# Patient Record
Sex: Female | Born: 2000 | Race: White | Hispanic: No | Marital: Single | Smoking: Never smoker
Health system: Southern US, Community
[De-identification: ages and names within clinical notes are randomized; demographics above are authoritative.]

## PROBLEM LIST (undated history)

## (undated) DIAGNOSIS — M419 Scoliosis, unspecified: Secondary | ICD-10-CM

## (undated) DIAGNOSIS — M549 Dorsalgia, unspecified: Secondary | ICD-10-CM

## (undated) DIAGNOSIS — F32A Depression, unspecified: Secondary | ICD-10-CM

## (undated) HISTORY — DX: Scoliosis, unspecified: M41.9

## (undated) HISTORY — DX: Dorsalgia, unspecified: M54.9

## (undated) HISTORY — DX: Depression, unspecified: F32.A

---
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
## 2019-07-06 LAB — PREGNANCY, URINE: Preg Test, Ur: POSITIVE

## 2019-07-27 ENCOUNTER — Encounter: Payer: Self-pay | Admitting: General Practice

## 2019-08-12 ENCOUNTER — Other Ambulatory Visit: Payer: Self-pay

## 2019-08-12 ENCOUNTER — Telehealth (INDEPENDENT_AMBULATORY_CARE_PROVIDER_SITE_OTHER): Payer: Medicaid Other | Admitting: *Deleted

## 2019-08-12 DIAGNOSIS — Z3480 Encounter for supervision of other normal pregnancy, unspecified trimester: Secondary | ICD-10-CM

## 2019-08-12 DIAGNOSIS — Z3687 Encounter for antenatal screening for uncertain dates: Secondary | ICD-10-CM

## 2019-08-12 DIAGNOSIS — Z3490 Encounter for supervision of normal pregnancy, unspecified, unspecified trimester: Secondary | ICD-10-CM

## 2019-08-12 MED ORDER — PRENATAL 27-0.8 MG PO TABS: 1.0000 | ORAL_TABLET | Freq: Every day | ORAL | 11 refills | Status: AC

## 2019-08-12 MED ORDER — BLOOD PRESSURE KIT DEVI: 1.0000 | 0 refills | Status: DC | PRN

## 2019-08-12 NOTE — Patient Instructions (Signed)

## 2019-08-12 NOTE — Progress Notes (Addendum)
I connected with  Monica Ali on 08/12/19 at  2:15 PM EDT by virtually and verified that I am speaking with the correct person using two identifiers.   I discussed the limitations, risks, security and privacy concerns of performing an evaluation and management service by virtually  and the availability of in person appointments. I also discussed with the patient that there may be a patient responsible charge related to this service. The patient expressed understanding and agreed to proceed.  I explained I am completing her New OB Intake today. We discussed Her EDD and she informed me that her last period was much shorter than usual - with less bleeding than usual. I informed her we recommend a dating [place] to be sure of how far along she is. She voices understanding and agrees with plan of care; she states she has limited dates to do [place] but can do [place] on 08/18/19 and [place] was scheduled for 08/18/19 and I informed her she will get results of dating [place] at her new ob visit 08/19/19. She voices understanding.   I reviewed her allergies, meds, OB History, Medical /Surgical history, and completed most of appropriate screenings. I informed her of [facility] services. She does report history of depression but states she is ok right now. I offered referral to [facility] but she declines at present. She also reports using a otc medication for cough/ cold that contains phenylephrine. I advised her to stop taking that medications and gave her options to take from our approved list. She voices understanding. She also asked for a PNV RX which I sent in per protocol.    I explained I will send her the Babyscripts app and app was sent to her while on phone.   I explained we will have her take her blood presure weekly at home. She confirmed she does not have a blood pressure cuff and has medicaid. I explained I will send a blood pressure cuff prescription  to [facility] that will fill that prescription and she will pick up the cuff.  I  confirmed I will send a MyChart message with details due to lagging connection.   I explained she will have some visits in office and some virtually. She already has Sports coach.  I reviewed her new ob  appointment date/ time with her , our location and to wear mask   I explained she will have a pelvic exam, ob bloodwork,  and  genetic testing if desired,- she does want a panorama. She voices understanding.  Addendum; some screenings not done to lagging connection.   Monica Biever,RN 08/12/2019  2:21 PM    Chart reviewed for nurse visit. Agree with plan of care.   Currie Paris, NP 08/13/2019 9:16 AM

## 2019-08-18 ENCOUNTER — Other Ambulatory Visit: Payer: Self-pay | Admitting: Nurse Practitioner

## 2019-08-18 ENCOUNTER — Ambulatory Visit
Admission: RE | Admit: 2019-08-18 | Discharge: 2019-08-18 | Disposition: A | Discharge status: Home / Self Care | Payer: Medicaid Other | Source: Ambulatory Visit | Attending: Nurse Practitioner | Admitting: Nurse Practitioner

## 2019-08-18 ENCOUNTER — Other Ambulatory Visit: Payer: Self-pay

## 2019-08-18 DIAGNOSIS — Z3687 Encounter for antenatal screening for uncertain dates: Secondary | ICD-10-CM

## 2019-08-18 DIAGNOSIS — Z3480 Encounter for supervision of other normal pregnancy, unspecified trimester: Secondary | ICD-10-CM

## 2019-08-19 ENCOUNTER — Other Ambulatory Visit (HOSPITAL_COMMUNITY)
Admission: RE | Admit: 2019-08-19 | Discharge: 2019-08-19 | Disposition: A | Discharge status: Home / Self Care | Payer: Medicaid Other | Source: Ambulatory Visit | Attending: Women's Health | Admitting: Women's Health

## 2019-08-19 ENCOUNTER — Encounter: Payer: Self-pay | Admitting: Nurse Practitioner

## 2019-08-19 ENCOUNTER — Ambulatory Visit (INDEPENDENT_AMBULATORY_CARE_PROVIDER_SITE_OTHER): Payer: Medicaid Other | Admitting: Nurse Practitioner

## 2019-08-19 VITALS — BP 126/88 | HR 76 | Ht 66.0 in | Wt 175.9 lb

## 2019-08-19 DIAGNOSIS — O09619 Supervision of young primigravida, unspecified trimester: Secondary | ICD-10-CM

## 2019-08-19 DIAGNOSIS — Z3A14 14 weeks gestation of pregnancy: Secondary | ICD-10-CM

## 2019-08-19 DIAGNOSIS — Z3492 Encounter for supervision of normal pregnancy, unspecified, second trimester: Secondary | ICD-10-CM

## 2019-08-19 DIAGNOSIS — O219 Vomiting of pregnancy, unspecified: Secondary | ICD-10-CM

## 2019-08-19 LAB — POCT URINALYSIS DIP (DEVICE)
Bilirubin Urine: NEGATIVE
Glucose, UA: NEGATIVE mg/dL
Hgb urine dipstick: NEGATIVE
Ketones, ur: NEGATIVE mg/dL
Leukocytes,Ua: NEGATIVE
Nitrite: NEGATIVE
Protein, ur: NEGATIVE mg/dL
Specific Gravity, Urine: 1.025 (ref 1.005–1.030)
Urobilinogen, UA: 0.2 mg/dL (ref 0.0–1.0)
pH: 7.5 (ref 5.0–8.0)

## 2019-08-19 MED ORDER — BLOOD PRESSURE KIT DEVI: 1.0000 | 0 refills | Status: AC | PRN

## 2019-08-19 MED ORDER — DOXYLAMINE-PYRIDOXINE 10-10 MG PO TBEC: DELAYED_RELEASE_TABLET | ORAL | 2 refills | Status: DC

## 2019-08-19 NOTE — Patient Instructions (Addendum)
  Go to [facility] to pick up BP cuff [address]  [place], [place]    Morning Sickness  Morning sickness is when you feel sick to your stomach (nauseous) during pregnancy. You may feel sick to your stomach and throw up (vomit). You may feel sick in the morning, but you can feel this way at any time of day. Some women feel very sick to their stomach and cannot stop throwing up (hyperemesis gravidarum). Follow these instructions at home: Medicines  Take over-the-counter and prescription medicines only as told by your doctor. Do not take any medicines until you talk with your doctor about them first.  Taking multivitamins before getting pregnant can stop or lessen the harshness of morning sickness. Eating and drinking  Eat dry toast or crackers before getting out of bed.  Eat 5 or 6 small meals a day.  Eat dry and bland foods like rice and baked potatoes.  Do not eat greasy, fatty, or spicy foods.  Have someone cook for you if the smell of food causes you to feel sick or throw up.  If you feel sick to your stomach after taking prenatal vitamins, take them at night or with a snack.  Eat protein when you need a snack. Nuts, yogurt, and cheese are good choices.  Drink fluids throughout the day.  Try ginger ale made with real ginger, ginger tea made from fresh grated ginger, or ginger candies. General instructions  Do not use any products that have nicotine or tobacco in them, such as cigarettes and e-cigarettes. If you need help quitting, ask your doctor.  Use an air purifier to keep the air in your house free of smells.  Get lots of fresh air.  Try to avoid smells that make you feel sick.  Try: ? Wearing a bracelet that is used for seasickness (acupressure wristband). ? Going to a doctor who puts thin needles into certain body points (acupuncture) to improve how you feel. Contact a doctor if:  You need medicine to feel better.  You feel dizzy or  light-headed.  You are losing weight. Get help right away if:  You feel very sick to your stomach and cannot stop throwing up.  You pass out (faint).  You have very bad pain in your belly. Summary  Morning sickness is when you feel sick to your stomach (nauseous) during pregnancy.  You may feel sick in the morning, but you can feel this way at any time of day.  Making some changes to what you eat may help your symptoms go away. This information is not intended to replace advice given to you by your health care provider. Make sure you discuss any questions you have with your health care provider. Document Revised: 12/14/2016 Document Reviewed: 02/02/2016 Elsevier Patient Education  2020 [employer].
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
## 2019-08-19 NOTE — Progress Notes (Signed)
NEW OB packet given  Home Medicaid Form completed  

## 2019-08-20 ENCOUNTER — Encounter: Payer: Self-pay | Admitting: *Deleted

## 2019-08-20 LAB — CBC/D/PLT+RPR+RH+ABO+RUB AB...
Antibody Screen: NEGATIVE
Basophils Absolute: 0 10*3/uL (ref 0.0–0.2)
Basos: 0 %
EOS (ABSOLUTE): 0.2 10*3/uL (ref 0.0–0.4)
Eos: 2 %
HCV Ab: <0.1 s/co ratio (ref 0.0–0.9)
HIV Screen 4th Generation wRfx: NONREACTIVE
Hematocrit: 37.9 % (ref 34.0–46.6)
Hemoglobin: 12.4 g/dL (ref 11.1–15.9)
Hepatitis B Surface Ag: NEGATIVE
Immature Grans (Abs): 0 10*3/uL (ref 0.0–0.1)
Immature Granulocytes: 0 %
Lymphocytes Absolute: 1.4 10*3/uL (ref 0.7–3.1)
Lymphs: 14 %
MCH: 28.5 pg (ref 26.6–33.0)
MCHC: 32.7 g/dL (ref 31.5–35.7)
MCV: 87 fL (ref 79–97)
Monocytes Absolute: 0.6 10*3/uL (ref 0.1–0.9)
Monocytes: 6 %
Neutrophils Absolute: 7.7 10*3/uL — ABNORMAL HIGH (ref 1.4–7.0)
Neutrophils: 78 %
Platelets: 218 10*3/uL (ref 150–450)
RBC: 4.35 x10E6/uL (ref 3.77–5.28)
RDW: 13.4 % (ref 11.7–15.4)
RPR Ser Ql: NONREACTIVE
Rh Factor: POSITIVE
Rubella Antibodies, IGG: 3.59 index (ref >0.99)
WBC: 9.9 10*3/uL (ref 3.4–10.8)

## 2019-08-20 LAB — GC/CHLAMYDIA PROBE AMP (~~LOC~~) NOT AT ARMC
Chlamydia: NEGATIVE
Comment: NEGATIVE
Comment: NORMAL
Neisseria Gonorrhea: NEGATIVE

## 2019-08-20 LAB — HCV INTERPRETATION

## 2019-08-21 LAB — URINE CULTURE, OB REFLEX

## 2019-08-21 LAB — CULTURE, OB URINE

## 2019-09-03 ENCOUNTER — Encounter: Payer: Self-pay | Admitting: General Practice

## 2019-09-18 ENCOUNTER — Other Ambulatory Visit: Payer: Self-pay

## 2019-09-18 ENCOUNTER — Ambulatory Visit (INDEPENDENT_AMBULATORY_CARE_PROVIDER_SITE_OTHER): Payer: Medicaid Other | Admitting: Medical

## 2019-09-18 ENCOUNTER — Encounter: Payer: Self-pay | Admitting: Medical

## 2019-09-18 VITALS — BP 115/77 | HR 92 | Wt 177.0 lb

## 2019-09-18 DIAGNOSIS — Z3492 Encounter for supervision of normal pregnancy, unspecified, second trimester: Secondary | ICD-10-CM

## 2019-09-18 NOTE — Progress Notes (Signed)
   PRENATAL VISIT NOTE  Subjective:  Monica Ali is a 19 y.o. G1P0 at [redacted]w[redacted]d being seen today for ongoing prenatal care.  She is currently monitored for the following issues for this low-risk pregnancy and has Supervision of low-risk pregnancy and Scoliosis on their problem list.  Patient reports vaginal irritation.  Contractions: Not present. Vag. Bleeding: None.  Movement: Present. Denies leaking of fluid.   The following portions of the patient's history were reviewed and updated as appropriate: allergies, current medications, past family history, past medical history, past social history, past surgical history and problem list.   Objective:   Vitals:   09/18/19 0903  BP: 115/77  Pulse: 92  Weight: 177 lb (80.3 kg)    Fetal Status: Fetal Heart Rate (bpm): 153   Movement: Present     General:  Alert, oriented and cooperative. Patient is in no acute distress.  Skin: Skin is warm and dry. No rash noted.   Cardiovascular: Normal heart rate noted  Respiratory: Normal respiratory effort, no problems with respiration noted  Abdomen: Soft, gravid, appropriate for gestational age.  Pain/Pressure: Present     Pelvic: Cervical exam deferred        Extremities: Normal range of motion.  Edema: None  Mental Status: Normal mood and affect. Normal behavior. Normal judgment and thought content.   Assessment and Plan:  Pregnancy: G1P0 at [redacted]w[redacted]d 1. Encounter for supervision of low-risk pregnancy in second trimester - AFP, Serum, Open Spina Bifida - Doing well - Peds list provided  - Anatomy US scheduled 09/28/19 - N/V has improved, no longer taking Diclegis - Advised to try a water-based lubricant for vaginal dryness and monistat for yeast if needed  Preterm labor/second trimester symptoms and general obstetric precautions including but not limited to vaginal bleeding, contractions, leaking of fluid and fetal movement were reviewed in detail with the patient. Please refer to After Visit  Summary for other counseling recommendations.   Return in about 4 weeks (around 10/16/2019) for LOB, Virtual.  Future Appointments  Date Time Provider Department Center  09/28/2019  8:45 AM [facility]    Vonzella Nipple, PA-C

## 2019-09-18 NOTE — Patient Instructions (Signed)
Second Trimester of Pregnancy  The second trimester is from week 14 through week 27 (month 4 through 6). This is often the time in pregnancy that you feel your best. Often times, morning sickness has lessened or quit. You may have more energy, and you may get hungry more often. Your unborn baby is growing rapidly. At the end of the sixth month, he or she is about 9 inches long and weighs about 1 pounds. You will likely feel the baby move between 18 and 20 weeks of pregnancy. Follow these instructions at home: Medicines  Take over-the-counter and prescription medicines only as told by your doctor. Some medicines are safe and some medicines are not safe during pregnancy.  Take a prenatal vitamin that contains at least 600 micrograms (mcg) of folic acid.  If you have trouble pooping (constipation), take medicine that will make your stool soft (stool softener) if your doctor approves. Eating and drinking   Eat regular, healthy meals.  Avoid raw meat and uncooked cheese.  If you get low calcium from the food you eat, talk to your doctor about taking a daily calcium supplement.  Avoid foods that are high in fat and sugars, such as fried and sweet foods.  If you feel sick to your stomach (nauseous) or throw up (vomit): ? Eat 4 or 5 small meals a day instead of 3 large meals. ? Try eating a few soda crackers. ? Drink liquids between meals instead of during meals.  To prevent constipation: ? Eat foods that are high in fiber, like fresh fruits and vegetables, whole grains, and beans. ? Drink enough fluids to keep your pee (urine) clear or pale yellow. Activity  Exercise only as told by your doctor. Stop exercising if you start to have cramps.  Do not exercise if it is too hot, too humid, or if you are in a place of great height (high altitude).  Avoid heavy lifting.  Wear low-heeled shoes. Sit and stand up straight.  You can continue to have sex unless your doctor tells you not  to. Relieving pain and discomfort  Wear a good support bra if your breasts are tender.  Take warm water baths (sitz baths) to soothe pain or discomfort caused by hemorrhoids. Use hemorrhoid cream if your doctor approves.  Rest with your legs raised if you have leg cramps or low back pain.  If you develop puffy, bulging veins (varicose veins) in your legs: ? Wear support hose or compression stockings as told by your doctor. ? Raise (elevate) your feet for 15 minutes, 3-4 times a day. ? Limit salt in your food. Prenatal care  Write down your questions. Take them to your prenatal visits.  Keep all your prenatal visits as told by your doctor. This is important. Safety  Wear your seat belt when driving.  Make a list of emergency phone numbers, including numbers for family, friends, the hospital, and police and fire departments. General instructions  Ask your doctor about the right foods to eat or for help finding a counselor, if you need these services.  Ask your doctor about local prenatal classes. Begin classes before month 6 of your pregnancy.  Do not use hot tubs, steam rooms, or saunas.  Do not douche or use tampons or scented sanitary pads.  Do not cross your legs for long periods of time.  Visit your dentist if you have not done so. Use a soft toothbrush to brush your teeth. Floss gently.  Avoid all smoking, herbs,   and alcohol. Avoid drugs that are not approved by your doctor.  Do not use any products that contain nicotine or tobacco, such as cigarettes and e-cigarettes. If you need help quitting, ask your doctor.  Avoid cat litter boxes and soil used by cats. These carry germs that can cause birth defects in the baby and can cause a loss of your baby (miscarriage) or stillbirth. Contact a doctor if:  You have mild cramps or pressure in your lower belly.  You have pain when you pee (urinate).  You have bad smelling fluid coming from your vagina.  You continue to  feel sick to your stomach (nauseous), throw up (vomit), or have watery poop (diarrhea).  You have a nagging pain in your belly area.  You feel dizzy. Get help right away if:  You have a fever.  You are leaking fluid from your vagina.  You have spotting or bleeding from your vagina.  You have severe belly cramping or pain.  You lose or gain weight rapidly.  You have trouble catching your breath and have chest pain.  You notice sudden or extreme puffiness (swelling) of your face, hands, ankles, feet, or legs.  You have not felt the baby move in over an hour.  You have severe headaches that do not go away when you take medicine.  You have trouble seeing. Summary  The second trimester is from week 14 through week 27 (months 4 through 6). This is often the time in pregnancy that you feel your best.  To take care of yourself and your unborn baby, you will need to eat healthy meals, take medicines only if your doctor tells you to do so, and do activities that are safe for you and your baby.  Call your doctor if you get sick or if you notice anything unusual about your pregnancy. Also, call your doctor if you need help with the right food to eat, or if you want to know what activities are safe for you. This information is not intended to replace advice given to you by your health care provider. Make sure you discuss any questions you have with your health care provider. Document Revised: 04/25/2018 Document Reviewed: 02/07/2016 Elsevier Patient Education  2020 Elsevier Inc.  AREA PEDIATRIC/FAMILY PRACTICE PHYSICIANS  [place] ([address]) . [facility] o Chambliss, MD; Eniola, MD; Hale, MD; Hensel, MD; McDiarmid, MD; McIntyer, MD; Neal, MD; Walden, MD o [address] [place], [place] [address] o [phone] o Mon-Fri 8:30-12:30, 1:30-5:00 o Providers come to see babies at [facility] o Accepting Medicaid . [facility] o Limited providers who accept newborns: Koirala, MD; Morrow, MD; Wolters, MD o [address], [place], [place] [address] o [phone] o Mon-Fri 8:00-5:30 o Babies seen by providers at [facility] o Does NOT accept Medicaid o Please call early in hospitalization for appointment (limited availability)  . [facility] o Mulberry, MD o [address] [place], [place] [address] o [phone] o Mon, Tue, Thur, Fri 8:30-5:00, Wed 10:00-7:00 (closed 1-2pm) o Babies seen by [facility] providers o Accepting Medicaid . [facility] o Rubin, MD o [address], [place], [place] [address] o [phone] o Mon-Fri 8:30-5:00, Sat 8:30-12:00 o Provider comes to see babies at [facility] o Accepting Medicaid o Must have been referred from current patients or contacted office prior to delivery . [facility] (  [facility]) o Brown, MD; Chandler, MD; Ettefagh, MD; Grant, MD; Lester, MD; McCormick, MD; McQueen, MD; Prose, MD; Simha, MD; Stanley, MD; Stryffeler, NP; Tebben, NP o [address], [place], [place] [address] o [phone] o Mon, Tue, Thur, Fri 8:30-5:30, Wed 9:30-5:30, Sat 8:30-12:30 o Babies seen by [facility] providers o Accepting Medicaid o Only accepting infants of first-time parents or siblings of current patients o Hospital discharge coordinator will make follow-up appointment . Jack Amos o [address], [place], [place]  [address] o [phone]   Fax - [phone] . [facility] o [address], [place], [place]  [address] o Phone - [phone]   Fax - [phone] . Shilpa Gosrani o [address], [place], [place]  [address] o [phone]  [place] ([address]) . [facility] o Bates, MD; [place], MD; Cooper, Cox, MD; MD; Davis, MD; Dovico, MD;  Ettefaugh, MD; Little, MD; Lowe, MD; Keiffer, MD; Melvin, MD; Sumner, MD; Williams, MD o [address], [place], [place] [address] o [phone] o Mon-Fri 8:30-5:00 (extended evenings Mon-Thur as needed), Sat-Sun 10:00-1:00 o Providers come to see babies at [facility] o Accepting Medicaid for families of first-time babies and families with all children in the household age 3 and under. Must register with office prior to making appointment (M-F only). . [facility] o Henson, NP; Knapp, MD; Lalonde, MD; Tysinger, PA o [address] [place], [place] [address] o [phone] o Mon-Fri 8:00-5:00 o Babies seen by providers at [facility] o Does NOT accept Medicaid/Commercial Insurance Only . [facility] [place] ([facility])  o Artis, MD; Barnes, MD; Bratton, MD; Coccaro, MD; Lockett Gardner, MD; Kramer, MD; [place], MD; Netherton, MD; Poleto, MD; Skinner, MD o [address] [place], [place] [address] o [phone] o Mon-Fri 8:30-5:30, Sat (Oct.-Mar.) 9:00-1:00 o Babies seen by providers at [facility] o Accepting Medicaid  [place] ([address]) . [facility] o Reid, MD; Warner, MD o [address], [place], [place] [address] o [phone] o Mon-Fri 8:30-5:00, Sat 8:30-12:00 o Providers come to see babies at [facility] o Does NOT accept Medicaid . [facility] o Becker, PA; Hagler, MD; Scifres, PA; Sun, MD; Swayne, MD o [address], [place], [place] [address] o [phone] o Mon-Fri 8:00-5:00 o Babies seen by providers at [facility] o Does NOT accept Medicaid o Only accepting babies of parents who are patients o Please call early in hospitalization for appointment (limited availability) . [facility] o Clark, MD; Frye, MD; Kelleher, MD; Mack, NP; Miller, MD; O'Keller, MD; Patterson, NP; Pudlo, MD; Puzio, MD; Thomas, MD;  Tucker, MD; Twiselton, MD o [address], [place], [place] [address] o [phone] o Mon-Fri 8:00-5:00, Sat 9:00-12:00 o Providers come to see babies at [facility] o Does NOT accept Medicaid  [place] ([address]) . [facility] o Limited providers accepting new patients: Brake, NP; Wharton, PA o [address], [place], [place] [address] o [phone] o Mon-Fri 8:00-5:00 o Babies seen by providers at [facility] o Does NOT accept Medicaid o Only accepting babies of parents who are patients o Please call early in hospitalization for appointment (limited availability) . [facility] o Gay, MD; Quinlan, MD o [address] [place], [place] [address] o [phone] (press 1 to schedule appointment) o Mon-Fri 8:00-5:00 o Providers come to   see babies at [facility] o Does NOT accept Medicaid . [facility] o Mazer, MD o [address] [place], [place] [address] o [phone] o Mon-Fri 8:30-5:00 (lunch 12:30-1:00), extended hours by appointment only Wed 5:00-6:30 o Babies seen by [facility] providers o Accepting Medicaid . [facility] o Banks, MD; Jordan, MD; Koberlein, MD o [address], [place], [place] [address] o [phone] o Mon-Fri 8:00-5:00 o Babies seen by [facility] providers o Does NOT accept Medicaid . [facility] o Parker, MD; Hunter, MD; Wallace, DO o [address] [place], [place] [address] o [phone] o Mon-Fri 8:00-5:00 o Babies seen by [facility] providers o Does NOT accept Medicaid . [facility] o Brandon, PA; Brecken, PA; Christy, NP; Dees, MD; DeClaire, MD; DeWeese, MD; Hansen, NP; Mills, NP; Parrish, NP; Smoot, NP; Annalise, MD; Vapne, MD o [address] [place], [place] [address] o [phone] o Mon-Fri 8:30-5:00, Sat 10:00-1:00 o Providers come to see babies at [facility] o Does NOT accept Medicaid o Free prenatal information session Tuesdays at 4:45pm . [facility] o Bouska, MD; Gordon, PA; Jeffery, PA; Weber, PA o [address] [place] [address] o [phone] o Mon-Fri 7:30-5:30 o Babies seen by [facility] providers . [facility] o [address], [place], [place]  [address] o [phone]   Fax - [phone]  [place] ([address]) . [facility] o Reese, MD o [address] [place], [place] [address] o [phone] o Mon-Thur 8:00-6:00 o Providers come to see babies at [facility] o Accepting Medicaid . [facility] o Anderson, NP; Badger, MD; Beal, PA; Spencer, PA o [address] [place], [place] [address] o [phone] o Mon-Thur 7:30-7:30, Fri 7:30-4:30 o Babies seen by [facility] providers o Accepting Medicaid . [facility] o Agbuya, MD; Klett, NP; Romgoolam, MD o [address], [place], [place] [address] o [phone] o Mon-Fri 8:30-5:00, Sat 8:30-12:00 o Providers come to see babies at [facility] o Accepting Medicaid o Must have "Meet & Greet" appointment at office prior to delivery . [facility] ([facility] o McCord, MD; Wallace, MD; Wood, MD o [address], [place], [place] [address] o [phone] o Mon-Wed 8:00-6:00, Thur-Fri 8:00-5:00, Sat 9:00-12:00 o Providers come to see babies at [facility] o Does NOT accept Medicaid o Only accepting siblings of current patients . [facility]  o [address], [place], [place]  [address] o [phone]   Fax - [phone] . [facility] o [address], [place], [place]  [address] o [phone]   Fax - [phone]  [place] ([address]) . [facility] o Cirigliano, DO; Matthews, DO o [address] [place], [place] [address] o [phone] o Mon-Fri 7:00-5:00 o Babies seen by [facility] providers o Does NOT accept Medicaid . [facility] o Briscoe, MD; Howley, PA; Moreira, PA o [address], [place], [place] [address] o [phone] o Mon-Fri 8:00-5:00 o Babies seen by [facility] providers o Accepting Medicaid . [facility] o Boyd, MD; Church, PA; Jones, NP; Osborn, PA o [address], , [place] [address] o [phone] o Mon-Fri 8:00-5:00 o Babies seen by providers at [facility] o   Accepting Medicaid  [place] ([address]) . [facility] [place] o [place], DO o [address] [place], [place] [address] o [phone] o Mon-Fri 8:00-5:00 o Babies seen by [facility] providers o Does NOT accept Medicaid o Limited availability, please call early in hospitalization to schedule follow-up . [facility] o Calderon, PA; Cummings, MD; Dillard, MD; Martin, PA; Olson, MD; VanDeven, PA o [address], [place], [place] [address] o [phone] o Mon-Fri 8:30-5:00, Sat 9:00-12:00 o Babies seen by providers at [facility] o Accepting Medicaid o Please register online then schedule online or call office o [email or web address] . [facility] ([facility] o Hunter, NP; Kumar, MD; Martin Rogers, PA o [address], [place], [place] [address] o [phone] o Mon-Fri 8:00-5:00 o Babies seen by providers at [facility] o Accepting Medicaid . [facility] ([facility] o [place], MD; Kristi Fleenor, NP; West, MD o [address], [place], [place] [address] o [phone] o Mon-Fri 8:00-5:30, Sat&Sun by  appointment (phones open at 8:30) o Babies seen by [facility] providers o Accepting Medicaid o Must be a first-time baby or sibling of current patient . [facility] [place]  o [address], [place], [place]  [address] o [phone]   Fax - [phone]  [place] ([address]) . [facility] o Brown, PA; Cowen, PA; Rice, MD; Helton, PA; Spry, MD o [address] [place], [place] [address] o [phone] o Mon-Thur 8:00-7:00, Fri 8:00-5:00, Sat 8:00-12:00, Sun 9:00-12:00 o Babies seen by [facility] providers o Accepting Medicaid . [facility] [place] o Coe-Goins, MD; [place], MD; Pierre-Louis, MD o [address] [place] [address], [place], [place] [address] o [phone] o Mon-Thur 8:00-5:00 o Babies seen by providers at [facility] o Accepting Medicaid . [facility] o Bratton, MD; Coe-Goins, MD; Hayes, MD; Lewis, MD; List, MD; Lott, MD; [place], MD; Moran, MD; O'Neal, MD; Pierre-Louis, MD; Pitonzo, MD; Scholer, MD; Spangle, MD o [address] [place], [place] [address] o [phone] o Mon-Fri 8:00-5:30, Sat (Oct.-Mar.) 9:00-1:00 o Babies seen by providers at [facility] o Accepting Medicaid o Must fill out new patient packet, available online at [email or web address] . [facility] ([facility]) o Friddle, NP; Harris, NP; Kelly, NP; Logan, MD; Melvin, PA; Poth, MD; Ramadoss, MD; Stanton, NP o [address], [place], [place] [address] o [phone] o Mon-Thur 8:00-5:30, Fri 8:00-5:00 o Babies seen by providers at [facility] o Accepting Medicaid  [place] ([address]) . [facility] o Dixon, PA; [place], MD; Pickard, MD; Tapia, PA o [address], [place], [place] [address] o [phone] o Mon-Fri 8:00-5:00 o Babies seen  by providers at [facility] o Accepting Medicaid   [place] ([address]) . [facility] [place] o Masneri, DO; Meyers, MD; Seifer, PA o [address], [place],  [address] o [phone] o Mon-Fri 8:00-5:00 o Babies seen by providers at [facility] o Does NOT accept Medicaid o Limited appointment availability, please call early in hospitalization  . [facility] [place] o Kunedd, DO; McGowen, MD o [address], [place],  [address] o [phone] o   Mon-Fri 8:00-5:00 o Babies seen by [facility] providers o Does NOT accept Medicaid . [facility] [place] o Cameron, MD; MacDonald, MD; Michaels, PA; Nayak, MD o [address] [place] [address], [place], [place] [address] o [phone] o Mon-Fri 8:00-5:00 o After hours clinic ([address] [place], [place] [address]) [phone] Mon-Fri 5:00-8:00, Sat 12:00-6:00, Sun 10:00-4:00 o Babies seen by [facility] providers o Accepting Medicaid . [facility] [place] o [address], [place], [place]  [address] o [phone]   Fax - [phone]  [place] ([address]) . [facility] [place] o Andy, MD o [address], [place], [place] [address] o [phone] o Mon-Fri 8:00-5:00 o Babies seen by [facility] providers o Does NOT accept Medicaid . [facility] [place] ([facility] [place]) o Eksir, MD o [address], [place], [place] [address] o [phone] o Mon-Thur 8:00-7:00, Fri 8:00-5:00, Sat 8:00-12:00 o Babies seen by providers at [facility] o Accepting Medicaid - but does not have vaccinations in office (must be received elsewhere) o Limited availability, please call early in hospitalization  [place] ([address]) . [facility]  o Charlene Flemming, MD o [address],   [address] o [phone]  Fax [phone]    

## 2019-09-19 LAB — AFP, SERUM, OPEN SPINA BIFIDA
AFP MoM: 1.02
AFP Value: 41.1 ng/mL
Gest. Age on Collection Date: 18 weeks
Maternal Age At EDD: 20.1 yr
OSBR Risk 1 IN: 10000
Test Results:: NEGATIVE
Weight: 177 [lb_av]

## 2019-09-28 ENCOUNTER — Other Ambulatory Visit: Payer: Self-pay | Admitting: Nurse Practitioner

## 2019-09-28 ENCOUNTER — Other Ambulatory Visit: Payer: Self-pay

## 2019-09-28 ENCOUNTER — Other Ambulatory Visit: Payer: Self-pay | Admitting: *Deleted

## 2019-09-28 ENCOUNTER — Ambulatory Visit: Payer: Medicaid Other | Attending: Nurse Practitioner

## 2019-09-28 DIAGNOSIS — Z3492 Encounter for supervision of normal pregnancy, unspecified, second trimester: Secondary | ICD-10-CM

## 2019-09-28 DIAGNOSIS — Z362 Encounter for other antenatal screening follow-up: Secondary | ICD-10-CM

## 2019-10-16 ENCOUNTER — Encounter: Payer: Self-pay | Admitting: Medical

## 2019-10-16 ENCOUNTER — Telehealth (INDEPENDENT_AMBULATORY_CARE_PROVIDER_SITE_OTHER): Payer: Medicaid Other | Admitting: Medical

## 2019-10-16 VITALS — BP 118/75 | HR 76

## 2019-10-16 DIAGNOSIS — Z3402 Encounter for supervision of normal first pregnancy, second trimester: Secondary | ICD-10-CM

## 2019-10-16 DIAGNOSIS — Z3492 Encounter for supervision of normal pregnancy, unspecified, second trimester: Secondary | ICD-10-CM

## 2019-10-16 DIAGNOSIS — Z3A21 21 weeks gestation of pregnancy: Secondary | ICD-10-CM

## 2019-10-16 NOTE — Progress Notes (Signed)
I connected with Rolene Course 10/16/19 at  9:15 AM EDT by: MyChart video and verified that I am speaking with the correct person using two identifiers.  Patient is located at home and provider is located at [facility].     The purpose of this virtual visit is to provide medical care while limiting exposure to the novel coronavirus. I discussed the limitations, risks, security and privacy concerns of performing an evaluation and management service by MyChart video and the availability of in person appointments. I also discussed with the patient that there may be a patient responsible charge related to this service. By engaging in this virtual visit, you consent to the provision of healthcare.  Additionally, you authorize for your insurance to be billed for the services provided during this visit.  The patient expressed understanding and agreed to proceed.  The following staff members participated in the virtual visit:  Corinda Gubler, CMA    PRENATAL VISIT NOTE  Subjective:  Meriam Wedin is a 19 y.o. G1P0 at [redacted]w[redacted]d  for phone visit for ongoing prenatal care.  She is currently monitored for the following issues for this low-risk pregnancy and has Supervision of low-risk pregnancy and Scoliosis on their problem list.  Patient reports no complaints.  Contractions: Not present. Vag. Bleeding: None.  Movement: Present. Denies leaking of fluid.   The following portions of the patient's history were reviewed and updated as appropriate: allergies, current medications, past family history, past medical history, past social history, past surgical history and problem list.   Objective:   Vitals:   10/16/19 0920  BP: 118/75  Pulse: 76   Self-Obtained  Fetal Status:     Movement: Present     Assessment and Plan:  Pregnancy: G1P0 at [redacted]w[redacted]d 1. Encounter for supervision of low-risk pregnancy in second trimester - Doing well - Has Peds list  - F/U US on 10/26/19  2. [redacted] weeks gestation of pregnancy  Preterm  labor symptoms and general obstetric precautions including but not limited to vaginal bleeding, contractions, leaking of fluid and fetal movement were reviewed in detail with the patient.  Return in about 4 weeks (around 11/13/2019) for LOB, Virtual.  Future Appointments  Date Time Provider Department Center  10/26/2019  9:45 AM [facility] NURSE [facility]  10/26/2019 10:00 AM [facility] US1 [facility]     Time spent on virtual visit: 15 minutes  Vonzella Nipple, PA-C

## 2019-10-16 NOTE — Patient Instructions (Signed)

## 2019-10-26 ENCOUNTER — Other Ambulatory Visit: Payer: Self-pay | Admitting: *Deleted

## 2019-10-26 ENCOUNTER — Encounter: Payer: Self-pay | Admitting: *Deleted

## 2019-10-26 ENCOUNTER — Other Ambulatory Visit: Payer: Self-pay

## 2019-10-26 ENCOUNTER — Ambulatory Visit: Payer: Medicaid Other | Attending: Obstetrics

## 2019-10-26 ENCOUNTER — Ambulatory Visit: Payer: Medicaid Other | Admitting: *Deleted

## 2019-10-26 DIAGNOSIS — Z3492 Encounter for supervision of normal pregnancy, unspecified, second trimester: Secondary | ICD-10-CM | POA: Insufficient documentation

## 2019-10-26 DIAGNOSIS — O365990 Maternal care for other known or suspected poor fetal growth, unspecified trimester, not applicable or unspecified: Secondary | ICD-10-CM

## 2019-10-26 DIAGNOSIS — Z3A23 23 weeks gestation of pregnancy: Secondary | ICD-10-CM | POA: Diagnosis not present

## 2019-10-26 DIAGNOSIS — O321XX0 Maternal care for breech presentation, not applicable or unspecified: Secondary | ICD-10-CM

## 2019-10-26 DIAGNOSIS — Z3687 Encounter for antenatal screening for uncertain dates: Secondary | ICD-10-CM | POA: Diagnosis not present

## 2019-10-26 DIAGNOSIS — Z362 Encounter for other antenatal screening follow-up: Secondary | ICD-10-CM

## 2019-11-16 ENCOUNTER — Encounter: Payer: Self-pay | Admitting: *Deleted

## 2019-11-16 ENCOUNTER — Ambulatory Visit: Payer: Medicaid Other | Attending: Obstetrics and Gynecology

## 2019-11-16 ENCOUNTER — Other Ambulatory Visit: Payer: Self-pay

## 2019-11-16 ENCOUNTER — Ambulatory Visit: Payer: Medicaid Other | Admitting: *Deleted

## 2019-11-16 VITALS — BP 120/70 | HR 83

## 2019-11-16 DIAGNOSIS — Z3492 Encounter for supervision of normal pregnancy, unspecified, second trimester: Secondary | ICD-10-CM | POA: Diagnosis present

## 2019-11-16 DIAGNOSIS — Z3687 Encounter for antenatal screening for uncertain dates: Secondary | ICD-10-CM | POA: Diagnosis not present

## 2019-11-16 DIAGNOSIS — Z362 Encounter for other antenatal screening follow-up: Secondary | ICD-10-CM | POA: Diagnosis not present

## 2019-11-16 DIAGNOSIS — O365920 Maternal care for other known or suspected poor fetal growth, second trimester, not applicable or unspecified: Secondary | ICD-10-CM | POA: Diagnosis not present

## 2019-11-16 DIAGNOSIS — Z3A26 26 weeks gestation of pregnancy: Secondary | ICD-10-CM

## 2019-11-16 DIAGNOSIS — O365990 Maternal care for other known or suspected poor fetal growth, unspecified trimester, not applicable or unspecified: Secondary | ICD-10-CM

## 2019-11-17 ENCOUNTER — Other Ambulatory Visit: Payer: Self-pay | Admitting: *Deleted

## 2019-11-17 DIAGNOSIS — IMO0002 Reserved for concepts with insufficient information to code with codable children: Secondary | ICD-10-CM

## 2019-11-29 ENCOUNTER — Inpatient Hospital Stay (HOSPITAL_COMMUNITY)
Admission: AD | Admit: 2019-11-29 | Discharge: 2019-11-29 | Disposition: A | Discharge status: Home / Self Care | Payer: Medicaid Other | Attending: Obstetrics & Gynecology | Admitting: Obstetrics & Gynecology

## 2019-11-29 ENCOUNTER — Emergency Department: Admit: 2019-11-29 | Payer: Self-pay

## 2019-11-29 ENCOUNTER — Other Ambulatory Visit: Payer: Self-pay

## 2019-11-29 ENCOUNTER — Encounter (HOSPITAL_COMMUNITY): Payer: Self-pay | Admitting: Obstetrics & Gynecology

## 2019-11-29 DIAGNOSIS — O26893 Other specified pregnancy related conditions, third trimester: Secondary | ICD-10-CM | POA: Insufficient documentation

## 2019-11-29 DIAGNOSIS — R42 Dizziness and giddiness: Secondary | ICD-10-CM | POA: Diagnosis not present

## 2019-11-29 DIAGNOSIS — M25559 Pain in unspecified hip: Secondary | ICD-10-CM | POA: Diagnosis not present

## 2019-11-29 DIAGNOSIS — Z3A28 28 weeks gestation of pregnancy: Secondary | ICD-10-CM | POA: Diagnosis not present

## 2019-11-29 LAB — URINALYSIS, ROUTINE W REFLEX MICROSCOPIC
Bilirubin Urine: NEGATIVE
Glucose, UA: NEGATIVE mg/dL
Hgb urine dipstick: NEGATIVE
Ketones, ur: NEGATIVE mg/dL
Leukocytes,Ua: NEGATIVE
Nitrite: NEGATIVE
Protein, ur: NEGATIVE mg/dL
Specific Gravity, Urine: 1.024 (ref 1.005–1.030)
pH: 6 (ref 5.0–8.0)

## 2019-11-29 LAB — COMPREHENSIVE METABOLIC PANEL
ALT: 16 U/L (ref 0–44)
AST: 12 U/L — ABNORMAL LOW (ref 15–41)
Albumin: 2.6 g/dL — ABNORMAL LOW (ref 3.5–5.0)
Alkaline Phosphatase: 71 U/L (ref 38–126)
Anion gap: 6 (ref 5–15)
BUN: 11 mg/dL (ref 6–20)
CO2: 24 mmol/L (ref 22–32)
Calcium: 8.7 mg/dL — ABNORMAL LOW (ref 8.9–10.3)
Chloride: 106 mmol/L (ref 98–111)
Creatinine, Ser: 0.71 mg/dL (ref 0.44–1.00)
GFR, Estimated: >60 mL/min (ref >60)
Glucose, Bld: 91 mg/dL (ref 70–99)
Potassium: 4 mmol/L (ref 3.5–5.1)
Sodium: 136 mmol/L (ref 135–145)
Total Bilirubin: 0.2 mg/dL — ABNORMAL LOW (ref 0.3–1.2)
Total Protein: 6.4 g/dL — ABNORMAL LOW (ref 6.5–8.1)

## 2019-11-29 LAB — CBC WITH DIFFERENTIAL/PLATELET
Abs Immature Granulocytes: 0.04 10*3/uL (ref 0.00–0.07)
Basophils Absolute: 0 10*3/uL (ref 0.0–0.1)
Basophils Relative: 0 %
Eosinophils Absolute: 0.1 10*3/uL (ref 0.0–0.5)
Eosinophils Relative: 1 %
HCT: 35.1 % — ABNORMAL LOW (ref 36.0–46.0)
Hemoglobin: 11 g/dL — ABNORMAL LOW (ref 12.0–15.0)
Immature Granulocytes: 0 %
Lymphocytes Relative: 12 %
Lymphs Abs: 1.3 10*3/uL (ref 0.7–4.0)
MCH: 27.1 pg (ref 26.0–34.0)
MCHC: 31.3 g/dL (ref 30.0–36.0)
MCV: 86.5 fL (ref 80.0–100.0)
Monocytes Absolute: 0.6 10*3/uL (ref 0.1–1.0)
Monocytes Relative: 6 %
Neutro Abs: 8.5 10*3/uL — ABNORMAL HIGH (ref 1.7–7.7)
Neutrophils Relative %: 81 %
Platelets: 216 10*3/uL (ref 150–400)
RBC: 4.06 MIL/uL (ref 3.87–5.11)
RDW: 14 % (ref 11.5–15.5)
WBC: 10.6 10*3/uL — ABNORMAL HIGH (ref 4.0–10.5)
nRBC: 0 % (ref 0.0–0.2)

## 2019-11-29 NOTE — MAU Provider Note (Signed)
Obstetric Attending MAU Note  Chief Complaint:  Dizziness and Hip Pain   First Provider Initiated Contact with Patient 11/29/19 1704     HPI: Monica Ali is a 19 y.o. G1P0 at 66w1dwho presents to maternity admissions reporting episodes of dizziness over the past three weeks.  Feels like she is anemic. No syncopal episodes, no loss of consciousness.  Also reports having some chest tightness this morning, went to Urgent Care for evaluation and they sent her here. No current SOB. Also complains of hip pain with growing pregnancy.  Denies any abnormal vaginal discharge, fevers, chills, sweats, dysuria, nausea, vomiting, other GI or GU symptoms or other general symptoms. Denies contractions, leakage of fluid or vaginal bleeding. Good fetal movement.   [facility]   Diagnosis Date Noted  . Supervision of low-risk pregnancy 08/12/2019  . Scoliosis 03/12/2011    Past Medical History:  Diagnosis Date  . Back pain   . Depression   . Scoliosis     OB History  Gravida Para Term Preterm AB Living  1            SAB TAB Ectopic Multiple Live Births               # Outcome Date GA Lbr Len/2nd Weight Sex Delivery Anes PTL Lv  1 Current             Past Surgical History:  Procedure Laterality Date  . WRIST SURGERY  2019    Family History: Family History  Problem Relation Age of Onset  . Breast cancer Mother   . Bipolar disorder Mother   . Cancer Mother        breast cancer  . Arthritis Father   . Ovarian cancer Maternal Aunt     Social History: Social History   Tobacco Use  . Smoking status: Never Smoker  . Smokeless tobacco: Never Used  Vaping Use  . Vaping Use: Former  Substance Use Topics  . Alcohol use: Not Currently  . Drug use: Not Currently    Types: Marijuana    Comment: a month     Allergies: No Known Allergies  Medications Prior to Admission  Medication Sig Dispense Refill Last Dose  . Prenatal Vit-Fe  Fumarate-FA (MULTIVITAMIN-PRENATAL) 27-0.8 MG TABS tablet Take 1 tablet by mouth daily at 12 noon. 30 tablet 11 11/28/2019 at Unknown time  . acetaminophen (TYLENOL) 325 MG tablet Take 650 mg by mouth every 6 (six) hours as needed.   Unknown at Unknown time  . Blood Pressure Monitoring (BLOOD PRESSURE KIT) DEVI 1 Device by Does not apply route as needed. 1 each 0     ROS: Pertinent findings in history of present illness.  Physical Exam  Blood pressure 120/70, pulse 99, temperature 98.4 F (36.9 C), temperature source Oral, resp. rate 16, last menstrual period 05/13/2019, SpO2 97 %. CONSTITUTIONAL: Well-developed, well-nourished female in no acute distress.  HENT:  Normocephalic, atraumatic, External right and left ear normal.  EYES: Conjunctivae and EOM are normal. Pupils are equal, round, and reactive to light. No scleral icterus.  NECK: Normal range of motion, supple, no masses SKIN: Skin is warm and dry. No rash noted. Not diaphoretic. No erythema. No pallor. NLubeck Alert and oriented to person, place, and time. Normal reflexes, muscle tone coordination. No cranial nerve deficit noted. PSYCHIATRIC: Normal mood and affect. Normal behavior. Normal judgment and thought content. CARDIOVASCULAR: Normal heart rate noted, regular rhythm RESPIRATORY:  Effort and breath sounds normal, no problems with respiration noted ABDOMEN: Gravid appropriate for gestational age MUSCULOSKELETAL: Normal range of motion. No edema and no tenderness. 2+ distal pulses.   FHT:  Baseline 155 , moderate variability, accelerations present, no decelerations Contractions: none   Labs: Results for orders placed or performed during the hospital encounter of 11/29/19 (from the past 24 hour(s))  Urinalysis, Routine w reflex microscopic Urine, Clean Catch     Status: Abnormal   Collection Time: 11/29/19  4:14 PM  Result Value Ref Range   Color, Urine YELLOW YELLOW   APPearance HAZY (A) CLEAR   Specific Gravity,  Urine 1.024 1.005 - 1.030   pH 6.0 5.0 - 8.0   Glucose, UA NEGATIVE NEGATIVE mg/dL   Hgb urine dipstick NEGATIVE NEGATIVE   Bilirubin Urine NEGATIVE NEGATIVE   Ketones, ur NEGATIVE NEGATIVE mg/dL   Protein, ur NEGATIVE NEGATIVE mg/dL   Nitrite NEGATIVE NEGATIVE   Leukocytes,Ua NEGATIVE NEGATIVE  CBC with Differential/Platelet     Status: Abnormal   Collection Time: 11/29/19  4:54 PM  Result Value Ref Range   WBC 10.6 (H) 4.0 - 10.5 K/uL   RBC 4.06 3.87 - 5.11 MIL/uL   Hemoglobin 11.0 (L) 12.0 - 15.0 g/dL   HCT 35.1 (L) 36 - 46 %   MCV 86.5 80.0 - 100.0 fL   MCH 27.1 26.0 - 34.0 pg   MCHC 31.3 30.0 - 36.0 g/dL   RDW 14.0 11.5 - 15.5 %   Platelets 216 150 - 400 K/uL   nRBC 0.0 0.0 - 0.2 %   Neutrophils Relative % 81 %   Neutro Abs 8.5 (H) 1.7 - 7.7 K/uL   Lymphocytes Relative 12 %   Lymphs Abs 1.3 0.7 - 4.0 K/uL   Monocytes Relative 6 %   Monocytes Absolute 0.6 0.1 - 1.0 K/uL   Eosinophils Relative 1 %   Eosinophils Absolute 0.1 0.0 - 0.5 K/uL   Basophils Relative 0 %   Basophils Absolute 0.0 0.0 - 0.1 K/uL   Immature Granulocytes 0 %   Abs Immature Granulocytes 0.04 0.00 - 0.07 K/uL  Comprehensive metabolic panel     Status: Abnormal   Collection Time: 11/29/19  4:54 PM  Result Value Ref Range   Sodium 136 135 - 145 mmol/L   Potassium 4.0 3.5 - 5.1 mmol/L   Chloride 106 98 - 111 mmol/L   CO2 24 22 - 32 mmol/L   Glucose, Bld 91 70 - 99 mg/dL   BUN 11 6 - 20 mg/dL   Creatinine, Ser 0.71 0.44 - 1.00 mg/dL   Calcium 8.7 (L) 8.9 - 10.3 mg/dL   Total Protein 6.4 (L) 6.5 - 8.1 g/dL   Albumin 2.6 (L) 3.5 - 5.0 g/dL   AST 12 (L) 15 - 41 U/L   ALT 16 0 - 44 U/L   Alkaline Phosphatase 71 38 - 126 U/L   Total Bilirubin 0.2 (L) 0.3 - 1.2 mg/dL   GFR, Estimated >60 >60 mL/min   Anion gap 6 5 - 15    Imaging:  [place] MFM OB FOLLOW UP  Result Date: 11/16/2019 ----------------------------------------------------------------------  OBSTETRICS REPORT                       (Signed  Final 11/16/2019 03:24 pm) ---------------------------------------------------------------------- Patient Info  ID #:       [id number]  D.O.B.:  [birth year] (18 yrs)  Name:       Monica Ali                   Visit Date: 11/16/2019 02:15 pm ---------------------------------------------------------------------- Performed By  Attending:        Tama High MD        Ref. Address:     Faculty Practice  Performed By:     Wilnette Kales        [facility]                                 [facility] at                                                             [place] for                                                             Women  Referred By:      Virginia Rochester NP ---------------------------------------------------------------------- Orders  #  Description                           Code        Ordered By  1  [place] MFM OB FOLLOW UP                   76816.01    YU FANG  2  [place] MFM UA CORD DOPPLER                76820.02    YU FANG ----------------------------------------------------------------------  #  Order #                     Accession #                Episode #  1  [id number]                   [id number]                 [id number]  2  [id number]                   [id number]                 [id number] ---------------------------------------------------------------------- Indications  Maternal care for known or suspected poor      O36.5920  fetal growth, second trimester, not applicable  or unspecified IUGR  Teen pregnancy                                 O75.89  Encounter for uncertain  dates (confirm)        Z36.87  Encounter for other antenatal screening        Z36.2  follow-up  Low Risk NIPS  [redacted] weeks gestation of pregnancy                Z3A.26 ---------------------------------------------------------------------- Fetal Evaluation  Num Of Fetuses:         1  Fetal Heart Rate(bpm):  166  Cardiac Activity:        Observed  Presentation:           Cephalic  Placenta:               Posterior  P. Cord Insertion:      Previously Visualized  Amniotic Fluid  AFI FV:      Within normal limits                              Largest Pocket(cm)                              3.69 ---------------------------------------------------------------------- Biometry  BPD:      64.9  mm     G. Age:  26w 2d         37  %    CI:        72.99   %    70 - 86                                                          FL/HC:      20.0   %    18.6 - 20.4  HC:      241.5  mm     G. Age:  26w 2d         23  %    HC/AC:      1.16        1.04 - 1.22  AC:      208.9  mm     G. Age:  25w 3d         18  %    FL/BPD:     74.4   %    71 - 87  FL:       48.3  mm     G. Age:  26w 1d         33  %    FL/AC:      23.1   %    20 - 24  Est. FW:     858  gm    1 lb 14 oz      22  % ---------------------------------------------------------------------- OB History  Gravidity:    1         Term:   0        Prem:   0        SAB:   0  TOP:          0       Ectopic:  0        Living: 0 ---------------------------------------------------------------------- Gestational Age  LMP:           26w 5d        Date:  05/13/19  EDD:   02/17/20  U/S Today:     26w 0d                                        EDD:   02/22/20  Best:          26w 2d     Det. ByLoman Chroman         EDD:   02/20/20                                      (08/18/19) ---------------------------------------------------------------------- Anatomy  Cranium:               Appears normal         LVOT:                   Appears normal  Cavum:                 Previously seen        Aortic Arch:            Previously seen  Ventricles:            Appears normal         Ductal Arch:            Previously seen  Choroid Plexus:        Previously seen        Diaphragm:              Appears normal  Cerebellum:            Previously seen        Stomach:                Appears normal, left                                                                         sided  Posterior Fossa:       Previously seen        Abdomen:                Appears normal  Nuchal Fold:           Not applicable (>10    Abdominal Wall:         Previously seen                         wks GA)  Face:                  Orbits and profile     Cord Vessels:           Appears normal (3                         previously seen                                vessel cord)  Lips:                  Previously seen        Kidneys:                Appear normal  Palate:                Previously seen        Bladder:                Appears normal  Thoracic:              Appears normal         Spine:                  Previously seen  Heart:                 Appears normal         Upper Extremities:      Previously seen                         (4CH, axis, and                         situs)  RVOT:                  Appears normal         Lower Extremities:      Previously seen  Other:  Female gender seen. Nasal bone visualized.  VC, 3VV and 3VTV          visualized. ---------------------------------------------------------------------- Doppler - Fetal Vessels  Umbilical Artery   S/D     %tile      RI    %tile   2.93       35    0.66       40 ---------------------------------------------------------------------- Cervix Uterus Adnexa  Cervix  Normal appearance by transabdominal scan.  Uterus  No abnormality visualized. ---------------------------------------------------------------------- Impression  Patient return for fetal growth assessment.  On previous  ultrasound, the estimated fetal weight was at the 8 percentile.  On today's ultrasound, amniotic fluid is normal and good fetal  activity seen.  Fetal growth is appropriate for gestational age.  Umbilical artery Doppler, performed because of growth  restriction on previous ultrasound, showed normal forward  diastolic flow.  We reassured the patient of the findings.  Because of  previous finding of growth restriction, we  recommend a follow-  up scan in 3 weeks to assess fetal growth. ---------------------------------------------------------------------- Recommendations  -An appointment was made for her to return in 3 weeks for  fetal growth assessment.  -If fetal growth restriction is seen, umbilical artery Dopplers  should be performed. ----------------------------------------------------------------------                  Tama High, MD Electronically Signed Final Report   11/16/2019 03:24 pm ----------------------------------------------------------------------  [place] MFM UA CORD DOPPLER  Result Date: 11/16/2019 ----------------------------------------------------------------------  OBSTETRICS REPORT                       (Signed Final 11/16/2019 03:24 pm) ---------------------------------------------------------------------- Patient Info  ID #:       [id number]                          D.O.B.:  09/10/2000 (18 yrs)  Name:  Rillie Bowne                   Visit Date: 11/16/2019 02:15 pm ---------------------------------------------------------------------- Performed By  Attending:        Tama High MD        Ref. Address:     Faculty Practice  Performed By:     Wilnette Kales        Location[facility]                    [facility]                                 [facility] at                                                             [place] for                                                             Women  Referred By:      Virginia Rochester NP ---------------------------------------------------------------------- Orders  #  Description                           Code        Ordered By  1  [place] MFM OB FOLLOW UP                   76816.01    YU FANG  2  [place] MFM UA CORD DOPPLER                76820.02    YU FANG ----------------------------------------------------------------------  #  Order #                     Accession #                Episode #  1  [id number]                    [id number]                 [id number]  2  [id number]                   [id number]                 [id number] ---------------------------------------------------------------------- Indications  Maternal care for known or suspected poor      O36.5920  fetal growth, second trimester, not applicable  or unspecified IUGR  Teen pregnancy                                 O75.89  Encounter for uncertain dates (confirm)        Z36.87  Encounter  for other antenatal screening        Z36.2  follow-up  Low Risk NIPS  [redacted] weeks gestation of pregnancy                Z3A.26 ---------------------------------------------------------------------- Fetal Evaluation  Num Of Fetuses:         1  Fetal Heart Rate(bpm):  166  Cardiac Activity:       Observed  Presentation:           Cephalic  Placenta:               Posterior  P. Cord Insertion:      Previously Visualized  Amniotic Fluid  AFI FV:      Within normal limits                              Largest Pocket(cm)                              3.69 ---------------------------------------------------------------------- Biometry  BPD:      64.9  mm     G. Age:  26w 2d         37  %    CI:        72.99   %    70 - 86                                                          FL/HC:      20.0   %    18.6 - 20.4  HC:      241.5  mm     G. Age:  26w 2d         23  %    HC/AC:      1.16        1.04 - 1.22  AC:      208.9  mm     G. Age:  25w 3d         18  %    FL/BPD:     74.4   %    71 - 87  FL:       48.3  mm     G. Age:  26w 1d         33  %    FL/AC:      23.1   %    20 - 24  Est. FW:     858  gm    1 lb 14 oz      22  % ---------------------------------------------------------------------- OB History  Gravidity:    1         Term:   0        Prem:   0        SAB:   0  TOP:          0       Ectopic:  0        Living: 0 ---------------------------------------------------------------------- Gestational Age  LMP:           26w 5d        Date:  05/13/19  EDD:   02/17/20  U/S Today:      26w 0d                                        EDD:   02/22/20  Best:          26w 2d     Det. ByLoman Chroman         EDD:   02/20/20                                      (08/18/19) ---------------------------------------------------------------------- Anatomy  Cranium:               Appears normal         LVOT:                   Appears normal  Cavum:                 Previously seen        Aortic Arch:            Previously seen  Ventricles:            Appears normal         Ductal Arch:            Previously seen  Choroid Plexus:        Previously seen        Diaphragm:              Appears normal  Cerebellum:            Previously seen        Stomach:                Appears normal, left                                                                        sided  Posterior Fossa:       Previously seen        Abdomen:                Appears normal  Nuchal Fold:           Not applicable (>88    Abdominal Wall:         Previously seen                         wks GA)  Face:                  Orbits and profile     Cord Vessels:           Appears normal (3                         previously seen                                vessel cord)  Lips:                  Previously seen        Kidneys:                Appear normal  Palate:                Previously seen        Bladder:                Appears normal  Thoracic:              Appears normal         Spine:                  Previously seen  Heart:                 Appears normal         Upper Extremities:      Previously seen                         (4CH, axis, and                         situs)  RVOT:                  Appears normal         Lower Extremities:      Previously seen  Other:  Female gender seen. Nasal bone visualized.  VC, 3VV and 3VTV          visualized. ---------------------------------------------------------------------- Doppler - Fetal Vessels  Umbilical Artery   S/D     %tile      RI    %tile   2.93       35    0.66       40  ---------------------------------------------------------------------- Cervix Uterus Adnexa  Cervix  Normal appearance by transabdominal scan.  Uterus  No abnormality visualized. ---------------------------------------------------------------------- Impression  Patient return for fetal growth assessment.  On previous  ultrasound, the estimated fetal weight was at the 8 percentile.  On today's ultrasound, amniotic fluid is normal and good fetal  activity seen.  Fetal growth is appropriate for gestational age.  Umbilical artery Doppler, performed because of growth  restriction on previous ultrasound, showed normal forward  diastolic flow.  We reassured the patient of the findings.  Because of  previous finding of growth restriction, we recommend a follow-  up scan in 3 weeks to assess fetal growth. ---------------------------------------------------------------------- Recommendations  -An appointment was made for her to return in 3 weeks for  fetal growth assessment.  -If fetal growth restriction is seen, umbilical artery Dopplers  should be performed. ----------------------------------------------------------------------                  Tama High, MD Electronically Signed Final Report   11/16/2019 03:24 pm ----------------------------------------------------------------------   MAU Course: Labs and EKG done, will follow up results and manage accordingly.  1726 EKG read by myself and Dr. [facility] [facility] Attending, current OB Fellow) >> NSR, no acute concerns.  Results for orders placed or performed during the hospital encounter of 11/29/19 (from the past 24 hour(s))  Urinalysis, Routine w reflex microscopic Urine, Clean Catch     Status: Abnormal   Collection Time: 11/29/19  4:14 PM  Result Value Ref Range   Color, Urine YELLOW YELLOW  APPearance HAZY (A) CLEAR   Specific Gravity, Urine 1.024 1.005 - 1.030   pH 6.0 5.0 - 8.0   Glucose, UA NEGATIVE NEGATIVE mg/dL   Hgb urine dipstick  NEGATIVE NEGATIVE   Bilirubin Urine NEGATIVE NEGATIVE   Ketones, ur NEGATIVE NEGATIVE mg/dL   Protein, ur NEGATIVE NEGATIVE mg/dL   Nitrite NEGATIVE NEGATIVE   Leukocytes,Ua NEGATIVE NEGATIVE  CBC with Differential/Platelet     Status: Abnormal   Collection Time: 11/29/19  4:54 PM  Result Value Ref Range   WBC 10.6 (H) 4.0 - 10.5 K/uL   RBC 4.06 3.87 - 5.11 MIL/uL   Hemoglobin 11.0 (L) 12.0 - 15.0 g/dL   HCT 35.1 (L) 36 - 46 %   MCV 86.5 80.0 - 100.0 fL   MCH 27.1 26.0 - 34.0 pg   MCHC 31.3 30.0 - 36.0 g/dL   RDW 14.0 11.5 - 15.5 %   Platelets 216 150 - 400 K/uL   nRBC 0.0 0.0 - 0.2 %   Neutrophils Relative % 81 %   Neutro Abs 8.5 (H) 1.7 - 7.7 K/uL   Lymphocytes Relative 12 %   Lymphs Abs 1.3 0.7 - 4.0 K/uL   Monocytes Relative 6 %   Monocytes Absolute 0.6 0.1 - 1.0 K/uL   Eosinophils Relative 1 %   Eosinophils Absolute 0.1 0.0 - 0.5 K/uL   Basophils Relative 0 %   Basophils Absolute 0.0 0.0 - 0.1 K/uL   Immature Granulocytes 0 %   Abs Immature Granulocytes 0.04 0.00 - 0.07 K/uL  Comprehensive metabolic panel     Status: Abnormal   Collection Time: 11/29/19  4:54 PM  Result Value Ref Range   Sodium 136 135 - 145 mmol/L   Potassium 4.0 3.5 - 5.1 mmol/L   Chloride 106 98 - 111 mmol/L   CO2 24 22 - 32 mmol/L   Glucose, Bld 91 70 - 99 mg/dL   BUN 11 6 - 20 mg/dL   Creatinine, Ser 0.71 0.44 - 1.00 mg/dL   Calcium 8.7 (L) 8.9 - 10.3 mg/dL   Total Protein 6.4 (L) 6.5 - 8.1 g/dL   Albumin 2.6 (L) 3.5 - 5.0 g/dL   AST 12 (L) 15 - 41 U/L   ALT 16 0 - 44 U/L   Alkaline Phosphatase 71 38 - 126 U/L   Total Bilirubin 0.2 (L) 0.3 - 1.2 mg/dL   GFR, Estimated >60 >60 mL/min   Anion gap 6 5 - 15      Assessment: 1. Dizziness   2. [redacted] weeks gestation of pregnancy   3. Pregnancy related hip pain in third trimester, antepartum     Plan: Patient was reassured about hip pain, maternity support belt recommended, also gave information about hip exercises which may help. Tylenol  recommended. Will not prescribe Flexeril given complaint of dizziness.  Evaluation for dizziness showed hematocrit of 35, not considered anemic in pregnancy.  CMET not concerning. EKG without any acute findings.  Patient reassured. If continues, may need extended outpatient cardiac monitoring and cardiology consult.  On review of chart, she has not been seen since her appointment on 10/16/19 at [redacted]w[redacted]d [facility] needs 2 hr GTT, third trimester labs, Tdap etc. Advised her to fast for 8 hours before next appointment (NPO after midnight before appointment).  Emphasized to patient that she will need appointments every two weeks during her current third trimester.  Preterm labor precautions and fetal kick counts reviewed.  Discharge home  and follow up at [facility] as scheduled, she was told to expect call from [facility] with appointment details.  Future Appointments  Date Time Provider [place]  12/08/2019  2:30 PM [facility] NURSE [facility]  12/08/2019  2:45 PM [facility] US6 [facility] [place] [facility] Follow up.   Specialty: Obstetrics and Gynecology Why: You will be contacted with appointment details. Nothing to eat or drink after midnight before next appointment as you will have a test to check for diabetes. Contact information: [address] [place] [address] [phone]              Allergies as of 11/29/2019   No Known Allergies     Medication List    TAKE these medications   acetaminophen 325 MG tablet Commonly known as: TYLENOL Take 650 mg by mouth every 6 (six) hours as needed.   Blood Pressure Kit Devi 1 Device by Does not apply route as needed.   multivitamin-prenatal 27-0.8 MG Tabs tablet Take 1 tablet by mouth daily at 12 noon.       Osborne Oman, MD 11/29/2019 5:48 PM

## 2019-11-29 NOTE — MAU Note (Signed)
Pt reports dizziness off/on for about 3 weeks. Pt reports she thinks she is anemic. Also reports pain in hips for the last week. Reports good fetal movement.

## 2019-11-29 NOTE — Discharge Instructions (Signed)
Dizziness Dizziness is a common problem. It is a feeling of unsteadiness or light-headedness. You may feel like you are about to faint. Dizziness can lead to injury if you stumble or fall. Anyone can become dizzy, but dizziness is more common in older adults. This condition can be caused by a number of things, including medicines, dehydration, or illness. Follow these instructions at home: Eating and drinking  Drink enough fluid to keep your urine clear or pale yellow. This helps to keep you from becoming dehydrated. Try to drink more clear fluids, such as water.  Do not drink alcohol.  Limit your caffeine intake if told to do so by your health care provider. Check ingredients and nutrition facts to see if a food or beverage contains caffeine.  Limit your salt (sodium) intake if told to do so by your health care provider. Check ingredients and nutrition facts to see if a food or beverage contains sodium. Activity  Avoid making quick movements. ? Rise slowly from chairs and steady yourself until you feel okay. ? In the morning, first sit up on the side of the bed. When you feel okay, stand slowly while you hold onto something until you know that your balance is fine.  If you need to stand in one place for a long time, move your legs often. Tighten and relax the muscles in your legs while you are standing.  Do not drive or use heavy machinery if you feel dizzy.  Avoid bending down if you feel dizzy. Place items in your home so that they are easy for you to reach without leaning over. Lifestyle  Do not use any products that contain nicotine or tobacco, such as cigarettes and e-cigarettes. If you need help quitting, ask your health care provider.  Try to reduce your stress level by using methods such as yoga or meditation. Talk with your health care provider if you need help to manage your stress. General instructions  Watch your dizziness for any changes.  Take over-the-counter and  prescription medicines only as told by your health care provider. Talk with your health care provider if you think that your dizziness is caused by a medicine that you are taking.  Tell a friend or a family member that you are feeling dizzy. If he or she notices any changes in your behavior, have this person call your health care provider.  Keep all follow-up visits as told by your health care provider. This is important. Contact a health care provider if:  Your dizziness does not go away.  Your dizziness or light-headedness gets worse.  You feel nauseous.  You have reduced hearing.  You have new symptoms.  You are unsteady on your feet or you feel like the room is spinning. Get help right away if:  You vomit or have diarrhea and are unable to eat or drink anything.  You have problems talking, walking, swallowing, or using your arms, hands, or legs.  You feel generally weak.  You are not thinking clearly or you have trouble forming sentences. It may take a friend or family member to notice this.  You have chest pain, abdominal pain, shortness of breath, or sweating.  Your vision changes.  You have any bleeding.  You have a severe headache.  You have neck pain or a stiff neck.  You have a fever. These symptoms may represent a serious problem that is an emergency. Do not wait to see if the symptoms will go away. Get medical help  right away. Call your local emergency services (911 in the [place] Do not drive yourself to the hospital. Summary  Dizziness is a feeling of unsteadiness or light-headedness. This condition can be caused by a number of things, including medicines, dehydration, or illness.  Anyone can become dizzy, but dizziness is more common in older adults.  Drink enough fluid to keep your urine clear or pale yellow. Do not drink alcohol.  Avoid making quick movements if you feel dizzy. Monitor your dizziness for any changes. This information is not intended to  replace advice given to you by your health care provider. Make sure you discuss any questions you have with your health care provider. Document Revised: 01/04/2017 Document Reviewed: 02/04/2016 Elsevier Patient Education  2020 [employer]   Hip and Back Pain in Pregnancy Back pain and hip pain during pregnancy is common. It may be caused by several factors that are related to changes during your pregnancy. Follow these instructions at home: Managing pain, stiffness, and swelling      If directed, for sudden (acute) pain, put ice on the painful area. ? Put ice in a plastic bag. ? Place a towel between your skin and the bag. ? Leave the ice on for 20 minutes, 2-3 times per day.  If directed, apply heat to the affected area before you exercise. Use the heat source that your health care provider recommends, such as a moist heat pack or a heating pad. ? Place a towel between your skin and the heat source. ? Leave the heat on for 20-30 minutes. ? Remove the heat if your skin turns bright red. This is especially important if you are unable to feel pain, heat, or cold. You may have a greater risk of getting burned.  If directed, massage the affected area. Activity  Exercise as told by your health care provider. Gentle exercise is the best way to prevent or manage back pain.  Listen to your body when lifting. If lifting hurts, ask for help or bend your knees. This uses your leg muscles instead of your back muscles.  Squat down when picking up something from the floor. Do not bend over.  Only use bed rest for short periods as told by your health care provider. Bed rest should only be used for the most severe episodes of back pain. Standing, sitting, and lying down  Do not stand in one place for long periods of time.  Use good posture when sitting. Make sure your head rests over your shoulders and is not hanging forward. Use a pillow on your lower back if necessary.  Try sleeping on  your side, preferably the left side, with a pregnancy support pillow or 1-2 regular pillows between your legs. ? If you have back pain after a night's rest, your bed may be too soft. ? A firm mattress may provide more support for your back during pregnancy. General instructions  Do not wear high heels.  Eat a healthy diet. Try to gain weight within your health care provider's recommendations.  Use a maternity girdle, elastic sling, or back brace as told by your health care provider.  Take over-the-counter and prescription medicines only as told by your health care provider.  Work with a physical therapist or massage therapist to find ways to manage back pain. Acupuncture or massage therapy may be helpful.  Keep all follow-up visits as told by your health care provider. This is important. Contact a health care provider if:  Your back pain  interferes with your daily activities.  You have increasing pain in other parts of your body. Get help right away if:  You develop numbness, tingling, weakness, or problems with the use of your arms or legs.  You develop severe back pain that is not controlled with medicine.  You have a change in bowel or bladder control.  You develop shortness of breath, dizziness, or you faint.  You develop nausea, vomiting, or sweating.  You have back pain that is a rhythmic, cramping pain similar to labor pains. Labor pain is usually 1-2 minutes apart, lasts for about 1 minute, and involves a bearing down feeling or pressure in your pelvis.  You have back pain and your water breaks or you have vaginal bleeding.  You have back pain or numbness that travels down your leg.  Your back pain developed after you fell.  You develop pain on one side of your back.  You see blood in your urine.  You develop skin blisters in the area of your back pain. Summary  Back pain may be caused by several factors that are related to changes during your  pregnancy.  Follow instructions as told by your health care provider for managing pain, stiffness, and swelling.  Exercise as told by your health care provider. Gentle exercise is the best way to prevent or manage back pain.  Take over-the-counter and prescription medicines only as told by your health care provider.  Keep all follow-up visits as told by your health care provider. This is important. This information is not intended to replace advice given to you by your health care provider. Make sure you discuss any questions you have with your health care provider. Document Revised: 04/22/2018 Document Reviewed: 06/19/2017 Elsevier Patient Education  2020 [employer].  Third Trimester of Pregnancy The third trimester is from week 28 through week 40 (months 7 through 9). The third trimester is a time when the unborn baby (fetus) is growing rapidly. At the end of the ninth month, the fetus is about 20 inches in length and weighs 6-10 pounds. Body changes during your third trimester Your body will continue to go through many changes during pregnancy. The changes vary from woman to woman. During the third trimester:  Your weight will continue to increase. You can expect to gain 25-35 pounds (11-16 kg) by the end of the pregnancy.  You may begin to get stretch marks on your hips, abdomen, and breasts.  You may urinate more often because the fetus is moving lower into your pelvis and pressing on your bladder.  You may develop or continue to have heartburn. This is caused by increased hormones that slow down muscles in the digestive tract.  You may develop or continue to have constipation because increased hormones slow digestion and cause the muscles that push waste through your intestines to relax.  You may develop hemorrhoids. These are swollen veins (varicose veins) in the rectum that can itch or be painful.  You may develop swollen, bulging veins (varicose veins) in your legs.  You  may have increased body aches in the pelvis, back, or thighs. This is due to weight gain and increased hormones that are relaxing your joints.  You may have changes in your hair. These can include thickening of your hair, rapid growth, and changes in texture. Some women also have hair loss during or after pregnancy, or hair that feels dry or thin. Your hair will most likely return to normal after your baby is born.  Your breasts will continue to grow and they will continue to become tender. A yellow fluid (colostrum) may leak from your breasts. This is the first milk you are producing for your baby.  Your belly button may stick out.  You may notice more swelling in your hands, face, or ankles.  You may have increased tingling or numbness in your hands, arms, and legs. The skin on your belly may also feel numb.  You may feel short of breath because of your expanding uterus.  You may have more problems sleeping. This can be caused by the size of your belly, increased need to urinate, and an increase in your body's metabolism.  You may notice the fetus "dropping," or moving lower in your abdomen (lightening).  You may have increased vaginal discharge.  You may notice your joints feel loose and you may have pain around your pelvic bone. What to expect at prenatal visits You will have prenatal exams every 2 weeks until week 36. Then you will have weekly prenatal exams. During a routine prenatal visit:  You will be weighed to make sure you and the baby are growing normally.  Your blood pressure will be taken.  Your abdomen will be measured to track your baby's growth.  The fetal heartbeat will be listened to.  Any test results from the previous visit will be discussed.  You may have a cervical check near your due date to see if your cervix has softened or thinned (effaced).  You will be tested for Group B streptococcus. This happens between 35 and 37 weeks. Your health care provider  may ask you:  What your birth plan is.  How you are feeling.  If you are feeling the baby move.  If you have had any abnormal symptoms, such as leaking fluid, bleeding, severe headaches, or abdominal cramping.  If you are using any tobacco products, including cigarettes, chewing tobacco, and electronic cigarettes.  If you have any questions. Other tests or screenings that may be performed during your third trimester include:  Blood tests that check for low iron levels (anemia).  Fetal testing to check the health, activity level, and growth of the fetus. Testing is done if you have certain medical conditions or if there are problems during the pregnancy.  Nonstress test (NST). This test checks the health of your baby to make sure there are no signs of problems, such as the baby not getting enough oxygen. During this test, a belt is placed around your belly. The baby is made to move, and its heart rate is monitored during movement. What is false labor? False labor is a condition in which you feel small, irregular tightenings of the muscles in the womb (contractions) that usually go away with rest, changing position, or drinking water. These are called Braxton Hicks contractions. Contractions may last for hours, days, or even weeks before true labor sets in. If contractions come at regular intervals, become more frequent, increase in intensity, or become painful, you should see your health care provider. What are the signs of labor?  Abdominal cramps.  Regular contractions that start at 10 minutes apart and become stronger and more frequent with time.  Contractions that start on the top of the uterus and spread down to the lower abdomen and back.  Increased pelvic pressure and dull back pain.  A watery or bloody mucus discharge that comes from the vagina.  Leaking of amniotic fluid. This is also known as your "water breaking." It could  be a slow trickle or a gush. Let your health care  provider know if it has a color or strange odor. If you have any of these signs, call your health care provider right away, even if it is before your due date. Follow these instructions at home: Medicines  Follow your health care provider's instructions regarding medicine use. Specific medicines may be either safe or unsafe to take during pregnancy.  Take a prenatal vitamin that contains at least 600 micrograms (mcg) of folic acid.  If you develop constipation, try taking a stool softener if your health care provider approves. Eating and drinking   Eat a balanced diet that includes fresh fruits and vegetables, whole grains, good sources of protein such as meat, eggs, or tofu, and low-fat dairy. Your health care provider will help you determine the amount of weight gain that is right for you.  Avoid raw meat and uncooked cheese. These carry germs that can cause birth defects in the baby.  If you have low calcium intake from food, talk to your health care provider about whether you should take a daily calcium supplement.  Eat four or five small meals rather than three large meals a day.  Limit foods that are high in fat and processed sugars, such as fried and sweet foods.  To prevent constipation: ? Drink enough fluid to keep your urine clear or pale yellow. ? Eat foods that are high in fiber, such as fresh fruits and vegetables, whole grains, and beans. Activity  Exercise only as directed by your health care provider. Most women can continue their usual exercise routine during pregnancy. Try to exercise for 30 minutes at least 5 days a week. Stop exercising if you experience uterine contractions.  Avoid heavy lifting.  Do not exercise in extreme heat or humidity, or at high altitudes.  Wear low-heel, comfortable shoes.  Practice good posture.  You may continue to have sex unless your health care provider tells you otherwise. Relieving pain and discomfort  Take frequent breaks  and rest with your legs elevated if you have leg cramps or low back pain.  Take warm sitz baths to soothe any pain or discomfort caused by hemorrhoids. Use hemorrhoid cream if your health care provider approves.  Wear a good support bra to prevent discomfort from breast tenderness.  If you develop varicose veins: ? Wear support pantyhose or compression stockings as told by your healthcare provider. ? Elevate your feet for 15 minutes, 3-4 times a day. Prenatal care  Write down your questions. Take them to your prenatal visits.  Keep all your prenatal visits as told by your health care provider. This is important. Safety  Wear your seat belt at all times when driving.  Make a list of emergency phone numbers, including numbers for family, friends, the hospital, and police and fire departments. General instructions  Avoid cat litter boxes and soil used by cats. These carry germs that can cause birth defects in the baby. If you have a cat, ask someone to clean the litter box for you.  Do not travel far distances unless it is absolutely necessary and only with the approval of your health care provider.  Do not use hot tubs, steam rooms, or saunas.  Do not drink alcohol.  Do not use any products that contain nicotine or tobacco, such as cigarettes and e-cigarettes. If you need help quitting, ask your health care provider.  Do not use any medicinal herbs or unprescribed drugs. These chemicals affect  the formation and growth of the baby.  Do not douche or use tampons or scented sanitary pads.  Do not cross your legs for long periods of time.  To prepare for the arrival of your baby: ? Take prenatal classes to understand, practice, and ask questions about labor and delivery. ? Make a trial run to the hospital. ? Visit the hospital and tour the maternity area. ? Arrange for maternity or paternity leave through employers. ? Arrange for family and friends to take care of pets while you  are in the hospital. ? Purchase a rear-facing car seat and make sure you know how to install it in your car. ? Pack your hospital bag. ? Prepare the baby's nursery. Make sure to remove all pillows and stuffed animals from the baby's crib to prevent suffocation.  Visit your dentist if you have not gone during your pregnancy. Use a soft toothbrush to brush your teeth and be gentle when you floss. Contact a health care provider if:  You are unsure if you are in labor or if your water has broken.  You become dizzy.  You have mild pelvic cramps, pelvic pressure, or nagging pain in your abdominal area.  You have lower back pain.  You have persistent nausea, vomiting, or diarrhea.  You have an unusual or bad smelling vaginal discharge.  You have pain when you urinate. Get help right away if:  Your water breaks before 37 weeks.  You have regular contractions less than 5 minutes apart before 37 weeks.  You have a fever.  You are leaking fluid from your vagina.  You have spotting or bleeding from your vagina.  You have severe abdominal pain or cramping.  You have rapid weight loss or weight gain.  You have shortness of breath with chest pain.  You notice sudden or extreme swelling of your face, hands, ankles, feet, or legs.  Your baby makes fewer than 10 movements in 2 hours.  You have severe headaches that do not go away when you take medicine.  You have vision changes. Summary  The third trimester is from week 28 through week 40, months 7 through 9. The third trimester is a time when the unborn baby (fetus) is growing rapidly.  During the third trimester, your discomfort may increase as you and your baby continue to gain weight. You may have abdominal, leg, and back pain, sleeping problems, and an increased need to urinate.  During the third trimester your breasts will keep growing and they will continue to become tender. A yellow fluid (colostrum) may leak from your  breasts. This is the first milk you are producing for your baby.  False labor is a condition in which you feel small, irregular tightenings of the muscles in the womb (contractions) that eventually go away. These are called Braxton Hicks contractions. Contractions may last for hours, days, or even weeks before true labor sets in.  Signs of labor can include: abdominal cramps; regular contractions that start at 10 minutes apart and become stronger and more frequent with time; watery or bloody mucus discharge that comes from the vagina; increased pelvic pressure and dull back pain; and leaking of amniotic fluid. This information is not intended to replace advice given to you by your health care provider. Make sure you discuss any questions you have with your health care provider. Document Revised: 04/24/2018 Document Reviewed: 02/07/2016 Elsevier Patient Education  2020 [employer].

## 2019-12-01 ENCOUNTER — Ambulatory Visit (INDEPENDENT_AMBULATORY_CARE_PROVIDER_SITE_OTHER): Payer: Medicaid Other | Admitting: Advanced Practice Midwife

## 2019-12-01 ENCOUNTER — Other Ambulatory Visit: Payer: Medicaid Other

## 2019-12-01 ENCOUNTER — Other Ambulatory Visit: Payer: Self-pay

## 2019-12-01 ENCOUNTER — Encounter: Payer: Self-pay | Admitting: Advanced Practice Midwife

## 2019-12-01 VITALS — BP 121/81 | HR 91 | Wt 197.0 lb

## 2019-12-01 DIAGNOSIS — Z23 Encounter for immunization: Secondary | ICD-10-CM

## 2019-12-01 DIAGNOSIS — Z3480 Encounter for supervision of other normal pregnancy, unspecified trimester: Secondary | ICD-10-CM

## 2019-12-01 DIAGNOSIS — Z3A28 28 weeks gestation of pregnancy: Secondary | ICD-10-CM | POA: Diagnosis not present

## 2019-12-01 DIAGNOSIS — Z3493 Encounter for supervision of normal pregnancy, unspecified, third trimester: Secondary | ICD-10-CM

## 2019-12-01 NOTE — Progress Notes (Signed)
   PRENATAL VISIT NOTE  Subjective:  Monica Ali is a 19 y.o. G1P0 at [redacted]w[redacted]d being seen today for ongoing prenatal care.  She is currently monitored for the following issues for this low-risk pregnancy and has Supervision of low-risk pregnancy and Scoliosis on their problem list.  Patient reports no complaints.  Contractions: Irritability. Vag. Bleeding: None.  Movement: Present. Denies leaking of fluid.   The following portions of the patient's history were reviewed and updated as appropriate: allergies, current medications, past family history, past medical history, past social history, past surgical history and problem list.   Objective:   Vitals:   12/01/19 0924  BP: 121/81  Pulse: 91  Weight: 197 lb (89.4 kg)    Fetal Status: Fetal Heart Rate (bpm): 154 Fundal Height: 28 cm Movement: Present     General:  Alert, oriented and cooperative. Patient is in no acute distress.  Skin: Skin is warm and dry. No rash noted.   Cardiovascular: Normal heart rate noted  Respiratory: Normal respiratory effort, no problems with respiration noted  Abdomen: Soft, gravid, appropriate for gestational age.  Pain/Pressure: Absent     Pelvic: Cervical exam deferred        Extremities: Normal range of motion.  Edema: None  Mental Status: Normal mood and affect. Normal behavior. Normal judgment and thought content.   Assessment and Plan:  Pregnancy: G1P0 at [redacted]w[redacted]d 1. [redacted] weeks gestation of pregnancy - 2 hour GTT and 28 week labs today  - Tdap vaccine greater than or equal to 7yo IM  2. Encounter for supervision of low-risk pregnancy in third trimester - 10/11> fetus was 8%tile > FU on 11/1 fetus was 22%tile - MFM planning to follow with growth [place]  Preterm labor symptoms and general obstetric precautions including but not limited to vaginal bleeding, contractions, leaking of fluid and fetal movement were reviewed in detail with the patient. Please refer to After Visit Summary for other counseling  recommendations.   Return in about 2 weeks (around 12/15/2019).  Future Appointments  Date Time Provider Department Center  12/01/2019 10:35 AM Armando Reichert, CNM [facility]  12/08/2019  2:30 PM [facility] NURSE [facility]  12/08/2019  2:45 PM [facility] US6 [facility]    Thressa Sheller DNP, CNM  12/01/19  10:14 AM

## 2019-12-01 NOTE — Patient Instructions (Signed)
COVID-19 Vaccination if You Are Pregnant or Breastfeeding ° °The Society for Maternal-Fetal Medicine (SMFM) and other pregnancy experts recommend that pregnant and lactating people be vaccinated against COVID-19. The Centers for Disease Control and Prevention (CDC) also recommend vaccination for “all people aged 20 years and older, including people who are pregnant, breastfeeding, trying to get pregnant now, or might become pregnant in the future.” Vaccination is the best way to reduce the risks of COVID-19 infection and COVID-related complications for both you and your baby. ° °Three vaccines are available to prevent COVID-19: °• The two-dose Pfizer vaccine for people 12 years and older--APPROVED by the [place] Food and Drug Administration on September 07, 2019 °• The two-dose Moderna vaccine for people 18 years and older--AUTHORIZED for emergency use °• The one-dose Johnson & Johnson vaccine for people 18 years and older (you may also see this vaccine referred to as the “Janssen vaccine”)--AUTHORIZED for emergency use ° °For those receiving the Pfizer and Moderna vaccines, the second dose is given 21 days (Pfizer) and 28 days (Moderna) after the first dose. The Johnson & Johnson vaccine is only one dose. ° °Information for Pregnant Individuals °If you are pregnant or planning to become pregnant and are thinking about getting vaccinated, consider talking with your health care professional about the vaccine.  ° °To help with your decision, you should consider the following key points: °Anyone can get the COVID vaccines free of charge regardless of immigration status or whether they have insurance. You may be asked for your social security number, but it is  °NOT required to get vaccinated. ° °What are benefits of getting the COVID-19 vaccines during pregnancy?  °• The vaccines can help protect you from getting COVID-19. With the two-dose vaccines, you must get both doses for maximum effectiveness. It’s not yet known how  long protection lasts. ° °• Another potential benefit is that getting the vaccine while pregnant may help you pass antiCOVID-19 antibodies to your baby. In numerous studies of vaccinated moms, antibodies were found in the umbilical cord blood of babies and in the mother’s breastmilk. ° °• The CDC, along with other federal partners, are monitoring people who have been vaccinated for serious side effects. So far, more than 139,000 pregnant people have been °vaccinated. No unexpected pregnancy or fetal problems have occurred. There have been no reports of any increased risk of pregnancy loss, growth problems, or birth defects. ° °• A safe vaccine is generally considered one in which the benefits of being vaccinated outweigh the risks. The current vaccines are not live vaccines. There is only a very small  °chance that they cross the placenta, so it’s unlikely that they even reach the fetus. Vaccines don’t affect future fertility. The only people who should NOT get vaccinated are those who have had a severe allergic reaction to vaccines in the past or any vaccine ingredients. ° °• Side effects may occur in the first 3 days after getting vaccinated.1 These include mild to moderate fever, headache, and muscle aches. Side effects may be worse after the second dose of the Pfizer and Moderna vaccines. Fever should be avoided during pregnancy,especially in the first trimester. Those who develop a fever after vaccination can take °acetaminophen (Tylenol). This medication is safe to use during pregnancy and does not  affect how the vaccine works.  ° °What are the known risks of getting COVID-19 during pregnancy?  °About 1 to 3 per 1,000 pregnant women with COVID-19 will develop severe disease. Compared with those who   aren’t pregnant, pregnant people infected by the COVID-19 virus: °• Are 3 times more likely to need ICU care °• Are 2 to 3 times more likely to need advanced life support and a breathing tube  °• Have a small  increased risk of dying due to COVID-19 °They may also be at increased risk of stillbirth and preterm birth. ° °What is my risk of getting COVID-19?  °Your risk of getting COVID-19 depends on the chance that you will come into contact with another infected person. The risk may be higher if you live in a community where there is a lot of COVID-19 infection or work in healthcare or another high-contact setting.  ° °What is my risk for severe complications if I get COVID-19?  °Data show that older pregnant women; those with preexisting health conditions, such as a body mass index higher than 35 kg/m2, diabetes, and heart disorders; and Black or Latinx women have an especially increased risk of severe disease and death from COVID-19. °  °If you still have questions about the vaccines or need more information, ask your health care provider or go to the Centers for Disease Control and Prevention’s COVID-19 vaccine webpage.  ° °An Update on the Johnson & Johnson Vaccine  ° °In April 2021, the FDA and CDC called for a brief pause to use of the Johnson & Johnson vaccine. They did so after reports of a severe side effect in a very small number of women younger than age 50 following vaccination. This side effect, called thrombosis with thrombocytopenia syndrome (TTS), causes blood clots (thrombosis) combined with low levels of platelets (thrombocytopenia). ° °TTS following the Johnson & Johnson vaccine is extremely rare. At the time of this update, it has occurred in only 7 people per 1 million Johnson & Johnson shots given. According to the CDC, being on hormonal birth control (the pill, patch, or ring), pregnancy, breastfeeding, or being recently pregnant does not make you more likely to develop TTS after getting the Johnson & Johnson vaccine. The pause was lifted on May 08, 2019, after the FDA and CDC determined that the known benefits of the Johnson & Johnson vaccine far outweigh the risks.  ° °Health care professionals  have been alerted to the possibility of this side effect in people who have received the Johnson & Johnson vaccine. National organizations continue to recommend COVID-19 vaccination with any of the vaccines for pregnant women. All women younger than age 50 years, whether pregnant, breastfeeding, or not, should be aware of the very rare risk of TTS after getting the Johnson & Johnson vaccine. The Pfizer and Moderna vaccines don’t have this risk. If you get the Johnson & Johnson vaccine,  °seek medical help right away if you develop any of the following symptoms within 3 weeks of getting your shot: ° °• Severe or persistent headaches or blurred vision °• Shortness of breath °• Chest pain °• Leg swelling °• Persistent abdominal pain °• Easy bruising or tiny blood spots under the skin beyond the injection site ° °Experts continue to collect health and safety information from pregnant people who have been vaccinated. If you have questions about vaccination during pregnancy, visit the CDC website or talk to your health care professional. Information for Breastfeeding/Lactating Individuals The Society for Maternal-Fetal Medicine and other pregnancy experts recommend COVID-19 vaccination for people who are breastfeeding/lactating. You don’t have to delay or stop  °breastfeeding just because you get vaccinated.  ° °Getting Vaccinated  °You can get vaccinated at   any time during pregnancy. The CDC is committed to monitoring the vaccine’s safety for all individuals. Your health professional or vaccine clinic may give you information about enrolling in the v-safe after vaccination health checker (see the box below).Even after you’re fully vaccinated, it is important to follow the CDC’s guidance for wearing a mask indoors in areas where there are substantial or high rates of COVID-19 infection.  ° °What Happens When You Enroll in v-Safe?  °The v-safe after vaccination health checker program lets the CDC check in with you after  your vaccination. At sign-up, you can indicate that you are pregnant. Once you do that, expect the following: °• Someone may call you from the v-safe program to ask initial questions and get more information. °• You may be asked to enroll in the vaccine pregnancy registry, which is collecting information about any effects of the vaccine during pregnancy. This is a great way to help scientists monitor the vaccine’s safety and effectiveness.  ° °References °1. Oliver SE, Gargano JW, Marin M, Wallace M, Curran KG, Chamberland M, et al. The Advisory Committee on Immunization Practices’ Interim Recommendation for Use of Pfizer-BioNTech  °COVID-19 Vaccine -- [place], December 2020. MMWR Morbidity and Mortality Weekly Report 2020;69. °2. FDA Briefing Document. Janssen Ad26.COV2.S Vaccine for the Prevention of COVID-19. 2021. Accessed Mar 20, 2019; Available from: https://www.fda.gov/media/146217/download °3. PFIZER-BIONTECH COVID-19 VACCINE [package insert] [place]: Pfizer and [place], [place]: [employer];2020. °4. FDA Briefing Document. Moderna COVID-19 Vaccine. 2020. Accessed 2020, Dec 18; Available from: https://www.fda.gov/media/144434/download  °5. Gray KJ, Bordt EA, Atyeo C, Deriso E, Akinwunmi B, Young N, et al. COVID-19 vaccine response in pregnant and lactating women: a cohort study. Am J Obstet Gynecol 2021 Mar 24. °6. Panagiotakopoulos L, Myers TR, Gee J, Lipkind HS, Kharbanda EO, Ryan DS, et al. SARS-CoV-2 Infection Among Hospitalized Pregnant Women: Reasons for Admission and Pregnancy  °Characteristics - [facility], March 1-Jun 14, 2018. MMWR Morb Mortal Wkly Rep 2020 Sep 23;69(38):1355-9. °7. Zambrano LD, Ellington S, Strid P, Galang RR, Oduyebo T, Tong VT, et al. Update: Characteristics of Symptomatic Women of Reproductive Age with Laboratory-Confirmed SARSCoV-2 Infection by Pregnancy Status - [place], January 22-October 18, 2018. MMWR Morb Mortal Wkly Rep 2020 Nov  6;69(44):1641-7. °8. Delahoy MJ, Whitaker M, O'Halloran A, Chai SJ, Kirley PD, Alden N, et al. Characteristics and Maternal and Birth Outcomes of Hospitalized Pregnant Women with Laboratory-Confirmed COVID-19 - COVID-[facility], [place], March 1-September 06, 2018. MMWR Morb Mortal Wkly Rep 2020 Sep 25;69(38):1347-54. ° °

## 2019-12-02 LAB — CBC
Hematocrit: 34.2 % (ref 34.0–46.6)
Hemoglobin: 11.3 g/dL (ref 11.1–15.9)
MCH: 27.6 pg (ref 26.6–33.0)
MCHC: 33 g/dL (ref 31.5–35.7)
MCV: 84 fL (ref 79–97)
Platelets: 213 10*3/uL (ref 150–450)
RBC: 4.09 x10E6/uL (ref 3.77–5.28)
RDW: 13.3 % (ref 11.7–15.4)
WBC: 10.4 10*3/uL (ref 3.4–10.8)

## 2019-12-02 LAB — HIV ANTIBODY (ROUTINE TESTING W REFLEX): HIV Screen 4th Generation wRfx: NONREACTIVE

## 2019-12-02 LAB — GLUCOSE TOLERANCE, 2 HOURS W/ 1HR
Glucose, 1 hour: 116 mg/dL (ref 65–179)
Glucose, 2 hour: 126 mg/dL (ref 65–152)
Glucose, Fasting: 89 mg/dL (ref 65–91)

## 2019-12-02 LAB — RPR: RPR Ser Ql: NONREACTIVE

## 2019-12-08 ENCOUNTER — Ambulatory Visit: Payer: Medicaid Other | Admitting: *Deleted

## 2019-12-08 ENCOUNTER — Ambulatory Visit: Payer: Medicaid Other | Attending: Obstetrics and Gynecology

## 2019-12-08 ENCOUNTER — Encounter: Payer: Self-pay | Admitting: *Deleted

## 2019-12-08 ENCOUNTER — Other Ambulatory Visit: Payer: Self-pay

## 2019-12-08 ENCOUNTER — Other Ambulatory Visit: Payer: Self-pay | Admitting: Obstetrics and Gynecology

## 2019-12-08 VITALS — BP 103/74 | HR 98

## 2019-12-08 DIAGNOSIS — O365930 Maternal care for other known or suspected poor fetal growth, third trimester, not applicable or unspecified: Secondary | ICD-10-CM | POA: Diagnosis present

## 2019-12-08 DIAGNOSIS — Z3A29 29 weeks gestation of pregnancy: Secondary | ICD-10-CM | POA: Diagnosis not present

## 2019-12-08 DIAGNOSIS — Z362 Encounter for other antenatal screening follow-up: Secondary | ICD-10-CM | POA: Diagnosis not present

## 2019-12-08 DIAGNOSIS — IMO0002 Reserved for concepts with insufficient information to code with codable children: Secondary | ICD-10-CM

## 2019-12-09 ENCOUNTER — Other Ambulatory Visit: Payer: Self-pay | Admitting: *Deleted

## 2019-12-09 DIAGNOSIS — O365930 Maternal care for other known or suspected poor fetal growth, third trimester, not applicable or unspecified: Secondary | ICD-10-CM

## 2019-12-15 ENCOUNTER — Encounter: Payer: Medicaid Other | Admitting: Obstetrics and Gynecology

## 2019-12-23 ENCOUNTER — Ambulatory Visit: Payer: Medicaid Other | Attending: Obstetrics and Gynecology

## 2019-12-23 ENCOUNTER — Ambulatory Visit: Payer: Medicaid Other | Admitting: *Deleted

## 2019-12-23 ENCOUNTER — Other Ambulatory Visit: Payer: Self-pay

## 2019-12-23 ENCOUNTER — Ambulatory Visit (HOSPITAL_BASED_OUTPATIENT_CLINIC_OR_DEPARTMENT_OTHER): Payer: Medicaid Other | Admitting: *Deleted

## 2019-12-23 VITALS — BP 123/67 | HR 91

## 2019-12-23 DIAGNOSIS — Z3A31 31 weeks gestation of pregnancy: Secondary | ICD-10-CM

## 2019-12-23 DIAGNOSIS — O365990 Maternal care for other known or suspected poor fetal growth, unspecified trimester, not applicable or unspecified: Secondary | ICD-10-CM | POA: Diagnosis present

## 2019-12-23 DIAGNOSIS — O365930 Maternal care for other known or suspected poor fetal growth, third trimester, not applicable or unspecified: Secondary | ICD-10-CM | POA: Diagnosis present

## 2019-12-23 DIAGNOSIS — Z362 Encounter for other antenatal screening follow-up: Secondary | ICD-10-CM

## 2019-12-23 NOTE — Procedures (Signed)
Monica Ali 2000-10-20 [redacted]w[redacted]d  Fetus A Non-Stress Test Interpretation for 12/23/19  Indication: IUGR  Fetal Heart Rate A Mode: External Baseline Rate (A): 135 bpm Variability: Moderate Accelerations: 10 x 10 Decelerations: None Multiple birth?: No  Uterine Activity Mode: Palpation, Toco Contraction Frequency (min): none noted Resting Tone Palpated: Relaxed Resting Time: Adequate  Interpretation (Fetal Testing) Nonstress Test Interpretation: Reactive Overall Impression: Reassuring for gestational age Comments: Reviewed tracing with Dr. Parke Poisson

## 2019-12-24 ENCOUNTER — Other Ambulatory Visit: Payer: Self-pay | Admitting: *Deleted

## 2019-12-24 DIAGNOSIS — O365930 Maternal care for other known or suspected poor fetal growth, third trimester, not applicable or unspecified: Secondary | ICD-10-CM

## 2019-12-29 ENCOUNTER — Encounter: Payer: Self-pay | Admitting: Obstetrics and Gynecology

## 2019-12-29 ENCOUNTER — Ambulatory Visit (INDEPENDENT_AMBULATORY_CARE_PROVIDER_SITE_OTHER): Payer: Medicaid Other | Admitting: Obstetrics and Gynecology

## 2019-12-29 ENCOUNTER — Other Ambulatory Visit: Payer: Self-pay

## 2019-12-29 VITALS — BP 115/77 | HR 98 | Wt 201.4 lb

## 2019-12-29 DIAGNOSIS — Z3A32 32 weeks gestation of pregnancy: Secondary | ICD-10-CM | POA: Insufficient documentation

## 2019-12-29 DIAGNOSIS — Z3490 Encounter for supervision of normal pregnancy, unspecified, unspecified trimester: Secondary | ICD-10-CM

## 2019-12-29 DIAGNOSIS — O365930 Maternal care for other known or suspected poor fetal growth, third trimester, not applicable or unspecified: Secondary | ICD-10-CM

## 2019-12-29 DIAGNOSIS — M419 Scoliosis, unspecified: Secondary | ICD-10-CM

## 2019-12-29 DIAGNOSIS — O365990 Maternal care for other known or suspected poor fetal growth, unspecified trimester, not applicable or unspecified: Secondary | ICD-10-CM | POA: Insufficient documentation

## 2019-12-29 NOTE — Patient Instructions (Addendum)
Third Trimester of Pregnancy The third trimester is from week 28 through week 40 (months 7 through 9). The third trimester is a time when the unborn baby (fetus) is growing rapidly. At the end of the ninth month, the fetus is about 20 inches in length and weighs 6-10 pounds. Body changes during your third trimester Your body will continue to go through many changes during pregnancy. The changes vary from woman to woman. During the third trimester:  Your weight will continue to increase. You can expect to gain 25-35 pounds (11-16 kg) by the end of the pregnancy.  You may begin to get stretch marks on your hips, abdomen, and breasts.  You may urinate more often because the fetus is moving lower into your pelvis and pressing on your bladder.  You may develop or continue to have heartburn. This is caused by increased hormones that slow down muscles in the digestive tract.  You may develop or continue to have constipation because increased hormones slow digestion and cause the muscles that push waste through your intestines to relax.  You may develop hemorrhoids. These are swollen veins (varicose veins) in the rectum that can itch or be painful.  You may develop swollen, bulging veins (varicose veins) in your legs.  You may have increased body aches in the pelvis, back, or thighs. This is due to weight gain and increased hormones that are relaxing your joints.  You may have changes in your hair. These can include thickening of your hair, rapid growth, and changes in texture. Some women also have hair loss during or after pregnancy, or hair that feels dry or thin. Your hair will most likely return to normal after your baby is born.  Your breasts will continue to grow and they will continue to become tender. A yellow fluid (colostrum) may leak from your breasts. This is the first milk you are producing for your baby.  Your belly button may stick out.  You may notice more swelling in your hands,  face, or ankles.  You may have increased tingling or numbness in your hands, arms, and legs. The skin on your belly may also feel numb.  You may feel short of breath because of your expanding uterus.  You may have more problems sleeping. This can be caused by the size of your belly, increased need to urinate, and an increase in your body's metabolism.  You may notice the fetus "dropping," or moving lower in your abdomen (lightening).  You may have increased vaginal discharge.  You may notice your joints feel loose and you may have pain around your pelvic bone. What to expect at prenatal visits You will have prenatal exams every 2 weeks until week 36. Then you will have weekly prenatal exams. During a routine prenatal visit:  You will be weighed to make sure you and the baby are growing normally.  Your blood pressure will be taken.  Your abdomen will be measured to track your baby's growth.  The fetal heartbeat will be listened to.  Any test results from the previous visit will be discussed.  You may have a cervical check near your due date to see if your cervix has softened or thinned (effaced).  You will be tested for Group B streptococcus. This happens between 35 and 37 weeks. Your health care provider may ask you:  What your birth plan is.  How you are feeling.  If you are feeling the baby move.  If you have had any abnormal   symptoms, such as leaking fluid, bleeding, severe headaches, or abdominal cramping.  If you are using any tobacco products, including cigarettes, chewing tobacco, and electronic cigarettes.  If you have any questions. Other tests or screenings that may be performed during your third trimester include:  Blood tests that check for low iron levels (anemia).  Fetal testing to check the health, activity level, and growth of the fetus. Testing is done if you have certain medical conditions or if there are problems during the pregnancy.  Nonstress test  (NST). This test checks the health of your baby to make sure there are no signs of problems, such as the baby not getting enough oxygen. During this test, a belt is placed around your belly. The baby is made to move, and its heart rate is monitored during movement. What is false labor? False labor is a condition in which you feel small, irregular tightenings of the muscles in the womb (contractions) that usually go away with rest, changing position, or drinking water. These are called Braxton Hicks contractions. Contractions may last for hours, days, or even weeks before true labor sets in. If contractions come at regular intervals, become more frequent, increase in intensity, or become painful, you should see your health care provider. What are the signs of labor?  Abdominal cramps.  Regular contractions that start at 10 minutes apart and become stronger and more frequent with time.  Contractions that start on the top of the uterus and spread down to the lower abdomen and back.  Increased pelvic pressure and dull back pain.  A watery or bloody mucus discharge that comes from the vagina.  Leaking of amniotic fluid. This is also known as your "water breaking." It could be a slow trickle or a gush. Let your health care provider know if it has a color or strange odor. If you have any of these signs, call your health care provider right away, even if it is before your due date. Follow these instructions at home: Medicines  Follow your health care provider's instructions regarding medicine use. Specific medicines may be either safe or unsafe to take during pregnancy.  Take a prenatal vitamin that contains at least 600 micrograms (mcg) of folic acid.  If you develop constipation, try taking a stool softener if your health care provider approves. Eating and drinking   Eat a balanced diet that includes fresh fruits and vegetables, whole grains, good sources of protein such as meat, eggs, or tofu,  and low-fat dairy. Your health care provider will help you determine the amount of weight gain that is right for you.  Avoid raw meat and uncooked cheese. These carry germs that can cause birth defects in the baby.  If you have low calcium intake from food, talk to your health care provider about whether you should take a daily calcium supplement.  Eat four or five small meals rather than three large meals a day.  Limit foods that are high in fat and processed sugars, such as fried and sweet foods.  To prevent constipation: ? Drink enough fluid to keep your urine clear or pale yellow. ? Eat foods that are high in fiber, such as fresh fruits and vegetables, whole grains, and beans. Activity  Exercise only as directed by your health care provider. Most women can continue their usual exercise routine during pregnancy. Try to exercise for 30 minutes at least 5 days a week. Stop exercising if you experience uterine contractions.  Avoid heavy lifting.  Do   not exercise in extreme heat or humidity, or at high altitudes.  Wear low-heel, comfortable shoes.  Practice good posture.  You may continue to have sex unless your health care provider tells you otherwise. Relieving pain and discomfort  Take frequent breaks and rest with your legs elevated if you have leg cramps or low back pain.  Take warm sitz baths to soothe any pain or discomfort caused by hemorrhoids. Use hemorrhoid cream if your health care provider approves.  Wear a good support bra to prevent discomfort from breast tenderness.  If you develop varicose veins: ? Wear support pantyhose or compression stockings as told by your healthcare provider. ? Elevate your feet for 15 minutes, 3-4 times a day. Prenatal care  Write down your questions. Take them to your prenatal visits.  Keep all your prenatal visits as told by your health care provider. This is important. Safety  Wear your seat belt at all times when driving.  Make  a list of emergency phone numbers, including numbers for family, friends, the hospital, and police and fire departments. General instructions  Avoid cat litter boxes and soil used by cats. These carry germs that can cause birth defects in the baby. If you have a cat, ask someone to clean the litter box for you.  Do not travel far distances unless it is absolutely necessary and only with the approval of your health care provider.  Do not use hot tubs, steam rooms, or saunas.  Do not drink alcohol.  Do not use any products that contain nicotine or tobacco, such as cigarettes and e-cigarettes. If you need help quitting, ask your health care provider.  Do not use any medicinal herbs or unprescribed drugs. These chemicals affect the formation and growth of the baby.  Do not douche or use tampons or scented sanitary pads.  Do not cross your legs for long periods of time.  To prepare for the arrival of your baby: ? Take prenatal classes to understand, practice, and ask questions about labor and delivery. ? Make a trial run to the hospital. ? Visit the hospital and tour the maternity area. ? Arrange for maternity or paternity leave through employers. ? Arrange for family and friends to take care of pets while you are in the hospital. ? Purchase a rear-facing car seat and make sure you know how to install it in your car. ? Pack your hospital bag. ? Prepare the baby's nursery. Make sure to remove all pillows and stuffed animals from the baby's crib to prevent suffocation.  Visit your dentist if you have not gone during your pregnancy. Use a soft toothbrush to brush your teeth and be gentle when you floss. Contact a health care provider if:  You are unsure if you are in labor or if your water has broken.  You become dizzy.  You have mild pelvic cramps, pelvic pressure, or nagging pain in your abdominal area.  You have lower back pain.  You have persistent nausea, vomiting, or  diarrhea.  You have an unusual or bad smelling vaginal discharge.  You have pain when you urinate. Get help right away if:  Your water breaks before 37 weeks.  You have regular contractions less than 5 minutes apart before 37 weeks.  You have a fever.  You are leaking fluid from your vagina.  You have spotting or bleeding from your vagina.  You have severe abdominal pain or cramping.  You have rapid weight loss or weight gain.  You have   shortness of breath with chest pain.  You notice sudden or extreme swelling of your face, hands, ankles, feet, or legs.  Your baby makes fewer than 10 movements in 2 hours.  You have severe headaches that do not go away when you take medicine.  You have vision changes. Summary  The third trimester is from week 28 through week 40, months 7 through 9. The third trimester is a time when the unborn baby (fetus) is growing rapidly.  During the third trimester, your discomfort may increase as you and your baby continue to gain weight. You may have abdominal, leg, and back pain, sleeping problems, and an increased need to urinate.  During the third trimester your breasts will keep growing and they will continue to become tender. A yellow fluid (colostrum) may leak from your breasts. This is the first milk you are producing for your baby.  False labor is a condition in which you feel small, irregular tightenings of the muscles in the womb (contractions) that eventually go away. These are called Braxton Hicks contractions. Contractions may last for hours, days, or even weeks before true labor sets in.  Signs of labor can include: abdominal cramps; regular contractions that start at 10 minutes apart and become stronger and more frequent with time; watery or bloody mucus discharge that comes from the vagina; increased pelvic pressure and dull back pain; and leaking of amniotic fluid. This information is not intended to replace advice given to you by your  health care provider. Make sure you discuss any questions you have with your health care provider. Document Revised: 04/24/2018 Document Reviewed: 02/07/2016 Elsevier Patient Education  2020 [employer]   Tic Disorders A tic disorder is a condition in which a person makes sudden and repeated movements or sounds (tics). There are three types of tic disorders:  Transient or provisional tic disorder (common). This type usually goes away within a year or two.  Chronic or persistent tic disorder. This type may last all through childhood and continue into the adult years.  Tourette syndrome (rare). This type lasts through all of life. It often occurs with other disorders. Tic disorders starts before age 5, usually between age of 56 and 27. These disorders cannot be cured, but there are many treatments that can help manage tics. Most tic disorders get better over time. What are the causes? The cause of this condition is not known. What are the signs or symptoms? The main symptom of this condition is experiencing tics. There are four type of tics:  Simple motor tics. These are movements in one area of the body.  Complex motor tics. These are movements in large areas or in several areas of the body.  Simple vocal tics. These are single sounds.  Complex vocal tics. These are sounds that include several words or phrases. Tics range in severity and may be more severe when you are stressed or tired. Tics can change over time. Symptoms of simple motor tics  Blinking, squinting, or eyebrow raising.  Nose wrinkling.  Mouth twitching, grimacing, or making tongue movements.  Head nodding or twisting.  Shoulder shrugging.  Arm jerking.  Foot shaking. Symptoms of complex motor tics  Grooming behavior, such as combing one's hair.  Smelling objects.  Jumping.  Imitating others' behavior.  Making rude or obscene gestures. Symptoms of simple vocal  tics  Coughing.  Humming.  Throat clearing.  Grunting.  Yawning.  Sniffing.  Barking.  Snorting. Symptoms of complex vocal tics  Imitating  what others say.  Saying words and sentences that may: ? Seem out of context. ? Be rude. How is this diagnosed? This condition is diagnosed based on:  Your symptoms.  Your medical history.  A physical exam.  An exam of your nervous system (neurological exam).  Tests. These may be done to rule out other conditions that cause symptoms like tics. Tests may include: ? Blood tests. ? Brain imaging tests. Your health care provider will ask you about:  The type of tics you have.  When the tics started and how often they happen.  How the tics affect your daily activities.  Other medical issues you may have.  Whether you take over-the-counter or prescription medicines.  Whether you use any drugs. You may be referred to a brain and nerve specialist (neurologist) or a mental health specialist for further evaluation. How is this treated? Treatment for this condition depends on how severe your tics are. If they are mild, you may not need treatment. If they are more severe, you may benefit from treatment. Some treatments include:  Cognitive behavioral therapy. This kind of therapy involves talking to a mental health professional. The therapist can help you to: ? Become more aware of your tics. ? Learn ways to control your tics. ? Know how to disguise your tics.  Family therapy. This kind of therapy provides education and emotional support for your family members.  Medicine that helps to control tics.  Medicine that is injected into the body to relax muscles (botulinum toxin). This may be a treatment option if your tics are severe.  Electrical stimulation of the brain (deep brain stimulation). This may be a treatment option if your tics are severe. Follow these instructions at home:  Take over-the-counter and prescription  medicines only as told by your health care provider.  Check with your health care provider before using any new prescription or over-the-counter medicines.  Keep all follow-up visits as told by your health care provider. This is important. Contact a health care provider if:  You are not able to take your medicines as prescribed.  Your symptoms get worse.  Your symptoms are interfering with your ability to function normally at home, work, or school.  You have new or unusual symptoms like pain or weakness.  Your symptoms make you feel depressed or anxious. Summary  A tic disorder is a condition in which a person makes sudden and repeated movements or sounds.  Tic disorders start before age 34, usually between the age of 75 and 73.  Many tic disorders are mild and do not need treatment.  These disorders cannot be cured, but there are many treatments that can help manage tics. This information is not intended to replace advice given to you by your health care provider. Make sure you discuss any questions you have with your health care provider. Document Revised: 12/14/2016 Document Reviewed: 01/20/2016 Elsevier Patient Education  2020 [employer].

## 2019-12-29 NOTE — Progress Notes (Signed)
PRENATAL VISIT NOTE  Subjective:  Monica Ali is a 19 y.o. G1P0 at [redacted]w[redacted]d being seen today for ongoing prenatal care.  She is currently monitored for the following issues for this low-risk pregnancy and has Supervision of low-risk pregnancy; Scoliosis; [redacted] weeks gestation of pregnancy; and IUGR (intrauterine growth restriction) affecting care of mother on their problem list.  Patient reports low back pain and cramping when lifting at work. Contractions: Irritability. Vag. Bleeding: None.  Movement: Present. Denies leaking of fluid.   Pt planning to get COVID vaccine s/p delivery. 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 No recent COVID outbreaks. Planning to breast and bottle feed. Plan to use pills for birth control. No tobacco use.  Question of possible tic disorder. Pt reports "twitches and stuttered breathing" since a young age. People at work have "asked if she has Tourette's".   The following portions of the patient's history were reviewed and updated as appropriate: allergies, current medications, past family history, past medical history, past social history, past surgical history and problem list.   Objective:   Vitals:   12/29/19 1536  BP: 115/77  Pulse: 98  Weight: 201 lb 6.4 oz (91.4 kg)    Fetal Status: Fetal Heart Rate (bpm): 144   Movement: Present     General:  Alert, oriented and cooperative. Patient is in no acute distress.  Skin: Skin is warm and dry. No rash noted.   Cardiovascular: Normal heart rate noted  Respiratory: Normal respiratory effort, no problems with respiration noted  Abdomen: Soft, gravid, appropriate for gestational age.  Pain/Pressure: Present     Pelvic: Cervical exam deferred        Extremities: Normal range of motion.  Edema: None  Mental Status: Normal mood and affect. Normal behavior. Normal judgment and thought content. No tics observed.   Assessment and Plan:  Pregnancy: G1P0 at [redacted]w[redacted]d 1. [redacted] weeks gestation of pregnancy, Encounter  for supervision of low-risk pregnancy, antepartum: No red flag signs/symptoms. 2hr gtt + third trimester labs wnl. Pt reports +FM and appropriate fundal height and +FHTs today. S/p tdap vaccine. Declines flu and COVID vaccines but planning on COVID vaccine in postpartum period s/p counseling today. - continue prenatal vitamins - plan for growth ultrasound on 12/15 as noted below  2. Scoliosis, unspecified scoliosis type, unspecified spinal region - provided work note regarding lifting restrictions per pt request  3. IUGR: Ultrasound at [redacted]w[redacted]d with EFW 7%. Normal Dopplers at [redacted]w[redacted]d. Appropriate fundal height today. - weekly Dopplers & BPP in addition to ultrasound q3-4w or as indicated by MFM - next f/u ultrasound and dopplers scheduled for 12/15 (pt aware)  4. Depression: No current medications. No mood or safety concerns today.  Preterm labor symptoms and general obstetric precautions including but not limited to vaginal bleeding, contractions, leaking of fluid and fetal movement were reviewed in detail with the patient. Please refer to After Visit Summary for other counseling recommendations.   Return in about 2 weeks (around 01/12/2020) for f/u prenatal appt in person, any provider.  Future Appointments  Date Time Provider Department Center  12/30/2019  3:15 PM [facility] NURSE [facility]  12/30/2019  3:30 PM [facility] US3 [facility]  01/06/2020  2:15 PM [facility] NURSE [facility]  01/06/2020  2:30 PM [facility] US3 [facility]  01/12/2020  3:00 PM [facility] NURSE [facility]  01/12/2020  3:15 PM [facility] US3 [facility]  01/13/2020  3:55 PM Venora Maples, MD [facility]  01/19/2020  1:30 PM [facility] NURSE  [facility]  01/19/2020  1:45 PM [facility] US4 [facility]  01/27/2020  3:55 PM Armando Reichert, CNM [facility]  02/03/2020  3:55 PM Armando Reichert, CNM [facility]  02/10/2020  3:55 PM Marylene Land, CNM [facility]  02/17/2020  3:55 PM Crissie Reese, Mary Sella, MD [facility]    Sheila Oats, MD OB Fellow, Faculty Practice 12/30/2019 2:40 PM

## 2019-12-30 ENCOUNTER — Ambulatory Visit: Payer: Medicaid Other | Admitting: *Deleted

## 2019-12-30 ENCOUNTER — Ambulatory Visit: Payer: Medicaid Other | Attending: Obstetrics and Gynecology

## 2019-12-30 ENCOUNTER — Encounter: Payer: Self-pay | Admitting: *Deleted

## 2019-12-30 VITALS — BP 120/73 | HR 105

## 2019-12-30 DIAGNOSIS — Z362 Encounter for other antenatal screening follow-up: Secondary | ICD-10-CM | POA: Diagnosis not present

## 2019-12-30 DIAGNOSIS — O365930 Maternal care for other known or suspected poor fetal growth, third trimester, not applicable or unspecified: Secondary | ICD-10-CM | POA: Diagnosis present

## 2019-12-30 DIAGNOSIS — Z3A32 32 weeks gestation of pregnancy: Secondary | ICD-10-CM

## 2019-12-31 ENCOUNTER — Other Ambulatory Visit: Payer: Self-pay | Admitting: *Deleted

## 2019-12-31 DIAGNOSIS — O365930 Maternal care for other known or suspected poor fetal growth, third trimester, not applicable or unspecified: Secondary | ICD-10-CM

## 2020-01-06 ENCOUNTER — Ambulatory Visit: Payer: BLUE CROSS/BLUE SHIELD | Admitting: *Deleted

## 2020-01-06 ENCOUNTER — Other Ambulatory Visit: Payer: Self-pay | Admitting: Obstetrics

## 2020-01-06 ENCOUNTER — Ambulatory Visit: Payer: BLUE CROSS/BLUE SHIELD | Attending: Obstetrics and Gynecology

## 2020-01-06 ENCOUNTER — Other Ambulatory Visit: Payer: Self-pay

## 2020-01-06 ENCOUNTER — Encounter: Payer: Self-pay | Admitting: *Deleted

## 2020-01-06 VITALS — BP 112/67 | HR 89

## 2020-01-06 DIAGNOSIS — O365930 Maternal care for other known or suspected poor fetal growth, third trimester, not applicable or unspecified: Secondary | ICD-10-CM | POA: Insufficient documentation

## 2020-01-06 DIAGNOSIS — Z362 Encounter for other antenatal screening follow-up: Secondary | ICD-10-CM

## 2020-01-06 DIAGNOSIS — Z3A33 33 weeks gestation of pregnancy: Secondary | ICD-10-CM

## 2020-01-12 ENCOUNTER — Encounter: Payer: Self-pay | Admitting: *Deleted

## 2020-01-12 ENCOUNTER — Ambulatory Visit: Payer: Medicaid Other | Attending: Obstetrics and Gynecology

## 2020-01-12 ENCOUNTER — Other Ambulatory Visit: Payer: Self-pay

## 2020-01-12 ENCOUNTER — Ambulatory Visit: Payer: Medicaid Other | Admitting: *Deleted

## 2020-01-12 VITALS — BP 123/78 | HR 102

## 2020-01-12 DIAGNOSIS — Z3A34 34 weeks gestation of pregnancy: Secondary | ICD-10-CM

## 2020-01-12 DIAGNOSIS — O365930 Maternal care for other known or suspected poor fetal growth, third trimester, not applicable or unspecified: Secondary | ICD-10-CM

## 2020-01-13 ENCOUNTER — Other Ambulatory Visit: Payer: Self-pay

## 2020-01-13 ENCOUNTER — Encounter: Payer: Self-pay | Admitting: Family Medicine

## 2020-01-13 ENCOUNTER — Ambulatory Visit (INDEPENDENT_AMBULATORY_CARE_PROVIDER_SITE_OTHER): Payer: Medicaid Other | Admitting: Family Medicine

## 2020-01-13 VITALS — BP 111/66 | HR 83 | Wt 202.3 lb

## 2020-01-13 DIAGNOSIS — O365930 Maternal care for other known or suspected poor fetal growth, third trimester, not applicable or unspecified: Secondary | ICD-10-CM | POA: Diagnosis not present

## 2020-01-13 DIAGNOSIS — M419 Scoliosis, unspecified: Secondary | ICD-10-CM | POA: Diagnosis not present

## 2020-01-13 DIAGNOSIS — Z3493 Encounter for supervision of normal pregnancy, unspecified, third trimester: Secondary | ICD-10-CM

## 2020-01-13 DIAGNOSIS — Z3A34 34 weeks gestation of pregnancy: Secondary | ICD-10-CM

## 2020-01-13 NOTE — Progress Notes (Signed)
   Subjective:  Monica Ali is a 19 y.o. G1P0 at [redacted]w[redacted]d being seen today for ongoing prenatal care.  She is currently monitored for the following issues for this high-risk pregnancy and has Supervision of low-risk pregnancy; Scoliosis; [redacted] weeks gestation of pregnancy; and IUGR (intrauterine growth restriction) affecting care of mother on their problem list.  Patient reports no complaints.  Contractions: Irritability. Vag. Bleeding: Bloody Show.  Movement: Present. Denies leaking of fluid.   The following portions of the patient's history were reviewed and updated as appropriate: allergies, current medications, past family history, past medical history, past social history, past surgical history and problem list. Problem list updated.  Objective:   Vitals:   01/13/20 1550  BP: 111/66  Pulse: 83  Weight: 202 lb 4.8 oz (91.8 kg)    Fetal Status: Fetal Heart Rate (bpm): 152   Movement: Present     General:  Alert, oriented and cooperative. Patient is in no acute distress.  Skin: Skin is warm and dry. No rash noted.   Cardiovascular: Normal heart rate noted  Respiratory: Normal respiratory effort, no problems with respiration noted  Abdomen: Soft, gravid, appropriate for gestational age. Pain/Pressure: Present     Pelvic: Vag. Bleeding: Bloody Show     Cervical exam deferred        Extremities: Normal range of motion.  Edema: None  Mental Status: Normal mood and affect. Normal behavior. Normal judgment and thought content.   Urinalysis:      Assessment and Plan:  Pregnancy: G1P0 at [redacted]w[redacted]d  1. Encounter for supervision of low-risk pregnancy in third trimester BP and FHR normal Swabs next visit  2. Poor fetal growth affecting management of mother in third trimester, single or unspecified fetus Last growth [place] on 01/06/2020 showed EFW 4.6% with suboptimal weight gain, BPP and cord doppler today reassuring Has f/u growth scheduled for 02/02/2020 which is past 37wks, will try to move this  back a week to help w delivery planning (also c/w plan from Dr. Judeth Cornfield note on 01/06/2020)  3. Scoliosis, unspecified scoliosis type, unspecified spinal region Never wore brace or had surgery  Preterm labor symptoms and general obstetric precautions including but not limited to vaginal bleeding, contractions, leaking of fluid and fetal movement were reviewed in detail with the patient. Please refer to After Visit Summary for other counseling recommendations.  Return in 2 weeks (on 01/27/2020).   Venora Maples, MD

## 2020-01-13 NOTE — Patient Instructions (Signed)
 Third Trimester of Pregnancy The third trimester is from week 28 through week 40 (months 7 through 9). The third trimester is a time when the unborn baby (fetus) is growing rapidly. At the end of the ninth month, the fetus is about 20 inches in length and weighs 6-10 pounds. Body changes during your third trimester Your body will continue to go through many changes during pregnancy. The changes vary from woman to woman. During the third trimester:  Your weight will continue to increase. You can expect to gain 25-35 pounds (11-16 kg) by the end of the pregnancy.  You may begin to get stretch marks on your hips, abdomen, and breasts.  You may urinate more often because the fetus is moving lower into your pelvis and pressing on your bladder.  You may develop or continue to have heartburn. This is caused by increased hormones that slow down muscles in the digestive tract.  You may develop or continue to have constipation because increased hormones slow digestion and cause the muscles that push waste through your intestines to relax.  You may develop hemorrhoids. These are swollen veins (varicose veins) in the rectum that can itch or be painful.  You may develop swollen, bulging veins (varicose veins) in your legs.  You may have increased body aches in the pelvis, back, or thighs. This is due to weight gain and increased hormones that are relaxing your joints.  You may have changes in your hair. These can include thickening of your hair, rapid growth, and changes in texture. Some women also have hair loss during or after pregnancy, or hair that feels dry or thin. Your hair will most likely return to normal after your baby is born.  Your breasts will continue to grow and they will continue to become tender. A yellow fluid (colostrum) may leak from your breasts. This is the first milk you are producing for your baby.  Your belly button may stick out.  You may notice more swelling in your  hands, face, or ankles.  You may have increased tingling or numbness in your hands, arms, and legs. The skin on your belly may also feel numb.  You may feel short of breath because of your expanding uterus.  You may have more problems sleeping. This can be caused by the size of your belly, increased need to urinate, and an increase in your body's metabolism.  You may notice the fetus "dropping," or moving lower in your abdomen (lightening).  You may have increased vaginal discharge.  You may notice your joints feel loose and you may have pain around your pelvic bone. What to expect at prenatal visits You will have prenatal exams every 2 weeks until week 36. Then you will have weekly prenatal exams. During a routine prenatal visit:  You will be weighed to make sure you and the baby are growing normally.  Your blood pressure will be taken.  Your abdomen will be measured to track your baby's growth.  The fetal heartbeat will be listened to.  Any test results from the previous visit will be discussed.  You may have a cervical check near your due date to see if your cervix has softened or thinned (effaced).  You will be tested for Group B streptococcus. This happens between 35 and 37 weeks. Your health care provider may ask you:  What your birth plan is.  How you are feeling.  If you are feeling the baby move.  If you have had any   abnormal symptoms, such as leaking fluid, bleeding, severe headaches, or abdominal cramping.  If you are using any tobacco products, including cigarettes, chewing tobacco, and electronic cigarettes.  If you have any questions. Other tests or screenings that may be performed during your third trimester include:  Blood tests that check for low iron levels (anemia).  Fetal testing to check the health, activity level, and growth of the fetus. Testing is done if you have certain medical conditions or if there are problems during the  pregnancy.  Nonstress test (NST). This test checks the health of your baby to make sure there are no signs of problems, such as the baby not getting enough oxygen. During this test, a belt is placed around your belly. The baby is made to move, and its heart rate is monitored during movement. What is false labor? False labor is a condition in which you feel small, irregular tightenings of the muscles in the womb (contractions) that usually go away with rest, changing position, or drinking water. These are called Braxton Hicks contractions. Contractions may last for hours, days, or even weeks before true labor sets in. If contractions come at regular intervals, become more frequent, increase in intensity, or become painful, you should see your health care provider. What are the signs of labor?  Abdominal cramps.  Regular contractions that start at 10 minutes apart and become stronger and more frequent with time.  Contractions that start on the top of the uterus and spread down to the lower abdomen and back.  Increased pelvic pressure and dull back pain.  A watery or bloody mucus discharge that comes from the vagina.  Leaking of amniotic fluid. This is also known as your "water breaking." It could be a slow trickle or a gush. Let your health care provider know if it has a color or strange odor. If you have any of these signs, call your health care provider right away, even if it is before your due date. Follow these instructions at home: Medicines  Follow your health care provider's instructions regarding medicine use. Specific medicines may be either safe or unsafe to take during pregnancy.  Take a prenatal vitamin that contains at least 600 micrograms (mcg) of folic acid.  If you develop constipation, try taking a stool softener if your health care provider approves. Eating and drinking   Eat a balanced diet that includes fresh fruits and vegetables, whole grains, good sources of protein  such as meat, eggs, or tofu, and low-fat dairy. Your health care provider will help you determine the amount of weight gain that is right for you.  Avoid raw meat and uncooked cheese. These carry germs that can cause birth defects in the baby.  If you have low calcium intake from food, talk to your health care provider about whether you should take a daily calcium supplement.  Eat four or five small meals rather than three large meals a day.  Limit foods that are high in fat and processed sugars, such as fried and sweet foods.  To prevent constipation: ? Drink enough fluid to keep your urine clear or pale yellow. ? Eat foods that are high in fiber, such as fresh fruits and vegetables, whole grains, and beans. Activity  Exercise only as directed by your health care provider. Most women can continue their usual exercise routine during pregnancy. Try to exercise for 30 minutes at least 5 days a week. Stop exercising if you experience uterine contractions.  Avoid heavy lifting.    Do not exercise in extreme heat or humidity, or at high altitudes.  Wear low-heel, comfortable shoes.  Practice good posture.  You may continue to have sex unless your health care provider tells you otherwise. Relieving pain and discomfort  Take frequent breaks and rest with your legs elevated if you have leg cramps or low back pain.  Take warm sitz baths to soothe any pain or discomfort caused by hemorrhoids. Use hemorrhoid cream if your health care provider approves.  Wear a good support bra to prevent discomfort from breast tenderness.  If you develop varicose veins: ? Wear support pantyhose or compression stockings as told by your healthcare provider. ? Elevate your feet for 15 minutes, 3-4 times a day. Prenatal care  Write down your questions. Take them to your prenatal visits.  Keep all your prenatal visits as told by your health care provider. This is important. Safety  Wear your seat belt at  all times when driving.  Make a list of emergency phone numbers, including numbers for family, friends, the hospital, and police and fire departments. General instructions  Avoid cat litter boxes and soil used by cats. These carry germs that can cause birth defects in the baby. If you have a cat, ask someone to clean the litter box for you.  Do not travel far distances unless it is absolutely necessary and only with the approval of your health care provider.  Do not use hot tubs, steam rooms, or saunas.  Do not drink alcohol.  Do not use any products that contain nicotine or tobacco, such as cigarettes and e-cigarettes. If you need help quitting, ask your health care provider.  Do not use any medicinal herbs or unprescribed drugs. These chemicals affect the formation and growth of the baby.  Do not douche or use tampons or scented sanitary pads.  Do not cross your legs for long periods of time.  To prepare for the arrival of your baby: ? Take prenatal classes to understand, practice, and ask questions about labor and delivery. ? Make a trial run to the hospital. ? Visit the hospital and tour the maternity area. ? Arrange for maternity or paternity leave through employers. ? Arrange for family and friends to take care of pets while you are in the hospital. ? Purchase a rear-facing car seat and make sure you know how to install it in your car. ? Pack your hospital bag. ? Prepare the baby's nursery. Make sure to remove all pillows and stuffed animals from the baby's crib to prevent suffocation.  Visit your dentist if you have not gone during your pregnancy. Use a soft toothbrush to brush your teeth and be gentle when you floss. Contact a health care provider if:  You are unsure if you are in labor or if your water has broken.  You become dizzy.  You have mild pelvic cramps, pelvic pressure, or nagging pain in your abdominal area.  You have lower back pain.  You have persistent  nausea, vomiting, or diarrhea.  You have an unusual or bad smelling vaginal discharge.  You have pain when you urinate. Get help right away if:  Your water breaks before 37 weeks.  You have regular contractions less than 5 minutes apart before 37 weeks.  You have a fever.  You are leaking fluid from your vagina.  You have spotting or bleeding from your vagina.  You have severe abdominal pain or cramping.  You have rapid weight loss or weight gain.  You   have shortness of breath with chest pain.  You notice sudden or extreme swelling of your face, hands, ankles, feet, or legs.  Your baby makes fewer than 10 movements in 2 hours.  You have severe headaches that do not go away when you take medicine.  You have vision changes. Summary  The third trimester is from week 28 through week 40, months 7 through 9. The third trimester is a time when the unborn baby (fetus) is growing rapidly.  During the third trimester, your discomfort may increase as you and your baby continue to gain weight. You may have abdominal, leg, and back pain, sleeping problems, and an increased need to urinate.  During the third trimester your breasts will keep growing and they will continue to become tender. A yellow fluid (colostrum) may leak from your breasts. This is the first milk you are producing for your baby.  False labor is a condition in which you feel small, irregular tightenings of the muscles in the womb (contractions) that eventually go away. These are called Braxton Hicks contractions. Contractions may last for hours, days, or even weeks before true labor sets in.  Signs of labor can include: abdominal cramps; regular contractions that start at 10 minutes apart and become stronger and more frequent with time; watery or bloody mucus discharge that comes from the vagina; increased pelvic pressure and dull back pain; and leaking of amniotic fluid. This information is not intended to replace advice  given to you by your health care provider. Make sure you discuss any questions you have with your health care provider. Document Revised: 04/24/2018 Document Reviewed: 02/07/2016 [employer] Patient Education  2020 [employer]   Contraception Choices Contraception, also called birth control, refers to methods or devices that prevent pregnancy. Hormonal methods Contraceptive implant  A contraceptive implant is a thin, plastic tube that contains a hormone. It is inserted into the upper part of the arm. It can remain in place for up to 3 years. Progestin-only injections Progestin-only injections are injections of progestin, a synthetic form of the hormone progesterone. They are given every 3 months by a health care provider. Birth control pills  Birth control pills are pills that contain hormones that prevent pregnancy. They must be taken once a day, preferably at the same time each day. Birth control patch  The birth control patch contains hormones that prevent pregnancy. It is placed on the skin and must be changed once a week for three weeks and removed on the fourth week. A prescription is needed to use this method of contraception. Vaginal ring  A vaginal ring contains hormones that prevent pregnancy. It is placed in the vagina for three weeks and removed on the fourth week. After that, the process is repeated with a new ring. A prescription is needed to use this method of contraception. Emergency contraceptive Emergency contraceptives prevent pregnancy after unprotected sex. They come in pill form and can be taken up to 5 days after sex. They work best the sooner they are taken after having sex. Most emergency contraceptives are available without a prescription. This method should not be used as your only form of birth control. Barrier methods Female condom  A female condom is a thin sheath that is worn over the penis during sex. Condoms keep sperm from going inside a woman's body. They can  be used with a spermicide to increase their effectiveness. They should be disposed after a single use. Female condom  A female condom is a soft,   loose-fitting sheath that is put into the vagina before sex. The condom keeps sperm from going inside a woman's body. They should be disposed after a single use. Diaphragm  A diaphragm is a soft, dome-shaped barrier. It is inserted into the vagina before sex, along with a spermicide. The diaphragm blocks sperm from entering the uterus, and the spermicide kills sperm. A diaphragm should be left in the vagina for 6-8 hours after sex and removed within 24 hours. A diaphragm is prescribed and fitted by a health care provider. A diaphragm should be replaced every 1-2 years, after giving birth, after gaining more than 15 lb (6.8 kg), and after pelvic surgery. Cervical cap  A cervical cap is a round, soft latex or plastic cup that fits over the cervix. It is inserted into the vagina before sex, along with spermicide. It blocks sperm from entering the uterus. The cap should be left in place for 6-8 hours after sex and removed within 48 hours. A cervical cap must be prescribed and fitted by a health care provider. It should be replaced every 2 years. Sponge  A sponge is a soft, circular piece of polyurethane foam with spermicide on it. The sponge helps block sperm from entering the uterus, and the spermicide kills sperm. To use it, you make it wet and then insert it into the vagina. It should be inserted before sex, left in for at least 6 hours after sex, and removed and thrown away within 30 hours. Spermicides Spermicides are chemicals that kill or block sperm from entering the cervix and uterus. They can come as a cream, jelly, suppository, foam, or tablet. A spermicide should be inserted into the vagina with an applicator at least 10-15 minutes before sex to allow time for it to work. The process must be repeated every time you have sex. Spermicides do not require  a prescription. Intrauterine contraception Intrauterine device (IUD) An IUD is a T-shaped device that is put in a woman's uterus. There are two types:  Hormone IUD.This type contains progestin, a synthetic form of the hormone progesterone. This type can stay in place for 3-5 years.  Copper IUD.This type is wrapped in copper wire. It can stay in place for 10 years.  Permanent methods of contraception Female tubal ligation In this method, a woman's fallopian tubes are sealed, tied, or blocked during surgery to prevent eggs from traveling to the uterus. Hysteroscopic sterilization In this method, a small, flexible insert is placed into each fallopian tube. The inserts cause scar tissue to form in the fallopian tubes and block them, so sperm cannot reach an egg. The procedure takes about 3 months to be effective. Another form of birth control must be used during those 3 months. Female sterilization This is a procedure to tie off the tubes that carry sperm (vasectomy). After the procedure, the man can still ejaculate fluid (semen). Natural planning methods Natural family planning In this method, a couple does not have sex on days when the woman could become pregnant. Calendar method This means keeping track of the length of each menstrual cycle, identifying the days when pregnancy can happen, and not having sex on those days. Ovulation method In this method, a couple avoids sex during ovulation. Symptothermal method This method involves not having sex during ovulation. The woman typically checks for ovulation by watching changes in her temperature and in the consistency of cervical mucus. Post-ovulation method In this method, a couple waits to have sex until after ovulation. Summary    Contraception, also called birth control, means methods or devices that prevent pregnancy.  Hormonal methods of contraception include implants, injections, pills, patches, vaginal rings, and emergency  contraceptives.  Barrier methods of contraception can include female condoms, female condoms, diaphragms, cervical caps, sponges, and spermicides.  There are two types of IUDs (intrauterine devices). An IUD can be put in a woman's uterus to prevent pregnancy for 3-5 years.  Permanent sterilization can be done through a procedure for males, females, or both.  Natural family planning methods involve not having sex on days when the woman could become pregnant. This information is not intended to replace advice given to you by your health care provider. Make sure you discuss any questions you have with your health care provider. Document Revised: 01/03/2017 Document Reviewed: 02/04/2016 [employer] Patient Education  2020 [employer]   Breastfeeding  Choosing to breastfeed is one of the best decisions you can make for yourself and your baby. A change in hormones during pregnancy causes your breasts to make breast milk in your milk-producing glands. Hormones prevent breast milk from being released before your baby is born. They also prompt milk flow after birth. Once breastfeeding has begun, thoughts of your baby, as well as his or her sucking or crying, can stimulate the release of milk from your milk-producing glands. Benefits of breastfeeding Research shows that breastfeeding offers many health benefits for infants and mothers. It also offers a cost-free and convenient way to feed your baby. For your baby  Your first milk (colostrum) helps your baby's digestive system to function better.  Special cells in your milk (antibodies) help your baby to fight off infections.  Breastfed babies are less likely to develop asthma, allergies, obesity, or type 2 diabetes. They are also at lower risk for sudden infant death syndrome (SIDS).  Nutrients in breast milk are better able to meet your baby's needs compared to infant formula.  Breast milk improves your baby's brain development. For  you  Breastfeeding helps to create a very special bond between you and your baby.  Breastfeeding is convenient. Breast milk costs nothing and is always available at the correct temperature.  Breastfeeding helps to burn calories. It helps you to lose the weight that you gained during pregnancy.  Breastfeeding makes your uterus return faster to its size before pregnancy. It also slows bleeding (lochia) after you give birth.  Breastfeeding helps to lower your risk of developing type 2 diabetes, osteoporosis, rheumatoid arthritis, cardiovascular disease, and breast, ovarian, uterine, and endometrial cancer later in life. Breastfeeding basics Starting breastfeeding  Find a comfortable place to sit or lie down, with your neck and back well-supported.  Place a pillow or a rolled-up blanket under your baby to bring him or her to the level of your breast (if you are seated). Nursing pillows are specially designed to help support your arms and your baby while you breastfeed.  Make sure that your baby's tummy (abdomen) is facing your abdomen.  Gently massage your breast. With your fingertips, massage from the outer edges of your breast inward toward the nipple. This encourages milk flow. If your milk flows slowly, you may need to continue this action during the feeding.  Support your breast with 4 fingers underneath and your thumb above your nipple (make the letter "C" with your hand). Make sure your fingers are well away from your nipple and your baby's mouth.  Stroke your baby's lips gently with your finger or nipple.  When your baby's mouth is open   wide enough, quickly bring your baby to your breast, placing your entire nipple and as much of the areola as possible into your baby's mouth. The areola is the colored area around your nipple. ? More areola should be visible above your baby's upper lip than below the lower lip. ? Your baby's lips should be opened and extended outward (flanged) to  ensure an adequate, comfortable latch. ? Your baby's tongue should be between his or her lower gum and your breast.  Make sure that your baby's mouth is correctly positioned around your nipple (latched). Your baby's lips should create a seal on your breast and be turned out (everted).  It is common for your baby to suck about 2-3 minutes in order to start the flow of breast milk. Latching Teaching your baby how to latch onto your breast properly is very important. An improper latch can cause nipple pain, decreased milk supply, and poor weight gain in your baby. Also, if your baby is not latched onto your nipple properly, he or she may swallow some air during feeding. This can make your baby fussy. Burping your baby when you switch breasts during the feeding can help to get rid of the air. However, teaching your baby to latch on properly is still the best way to prevent fussiness from swallowing air while breastfeeding. Signs that your baby has successfully latched onto your nipple  Silent tugging or silent sucking, without causing you pain. Infant's lips should be extended outward (flanged).  Swallowing heard between every 3-4 sucks once your milk has started to flow (after your let-down milk reflex occurs).  Muscle movement above and in front of his or her ears while sucking. Signs that your baby has not successfully latched onto your nipple  Sucking sounds or smacking sounds from your baby while breastfeeding.  Nipple pain. If you think your baby has not latched on correctly, slip your finger into the corner of your baby's mouth to break the suction and place it between your baby's gums. Attempt to start breastfeeding again. Signs of successful breastfeeding Signs from your baby  Your baby will gradually decrease the number of sucks or will completely stop sucking.  Your baby will fall asleep.  Your baby's body will relax.  Your baby will retain a small amount of milk in his or her  mouth.  Your baby will let go of your breast by himself or herself. Signs from you  Breasts that have increased in firmness, weight, and size 1-3 hours after feeding.  Breasts that are softer immediately after breastfeeding.  Increased milk volume, as well as a change in milk consistency and color by the fifth day of breastfeeding.  Nipples that are not sore, cracked, or bleeding. Signs that your baby is getting enough milk  Wetting at least 1-2 diapers during the first 24 hours after birth.  Wetting at least 5-6 diapers every 24 hours for the first week after birth. The urine should be clear or pale yellow by the age of 5 days.  Wetting 6-8 diapers every 24 hours as your baby continues to grow and develop.  At least 3 stools in a 24-hour period by the age of 5 days. The stool should be soft and yellow.  At least 3 stools in a 24-hour period by the age of 7 days. The stool should be seedy and yellow.  No loss of weight greater than 10% of birth weight during the first 3 days of life.  Average weight gain   of 4-7 oz (113-198 g) per week after the age of 4 days.  Consistent daily weight gain by the age of 5 days, without weight loss after the age of 2 weeks. After a feeding, your baby may spit up a small amount of milk. This is normal. Breastfeeding frequency and duration Frequent feeding will help you make more milk and can prevent sore nipples and extremely full breasts (breast engorgement). Breastfeed when you feel the need to reduce the fullness of your breasts or when your baby shows signs of hunger. This is called "breastfeeding on demand." Signs that your baby is hungry include:  Increased alertness, activity, or restlessness.  Movement of the head from side to side.  Opening of the mouth when the corner of the mouth or cheek is stroked (rooting).  Increased sucking sounds, smacking lips, cooing, sighing, or squeaking.  Hand-to-mouth movements and sucking on fingers or  hands.  Fussing or crying. Avoid introducing a pacifier to your baby in the first 4-6 weeks after your baby is born. After this time, you may choose to use a pacifier. Research has shown that pacifier use during the first year of a baby's life decreases the risk of sudden infant death syndrome (SIDS). Allow your baby to feed on each breast as long as he or she wants. When your baby unlatches or falls asleep while feeding from the first breast, offer the second breast. Because newborns are often sleepy in the first few weeks of life, you may need to awaken your baby to get him or her to feed. Breastfeeding times will vary from baby to baby. However, the following rules can serve as a guide to help you make sure that your baby is properly fed:  Newborns (babies 4 weeks of age or younger) may breastfeed every 1-3 hours.  Newborns should not go without breastfeeding for longer than 3 hours during the day or 5 hours during the night.  You should breastfeed your baby a minimum of 8 times in a 24-hour period. Breast milk pumping     Pumping and storing breast milk allows you to make sure that your baby is exclusively fed your breast milk, even at times when you are unable to breastfeed. This is especially important if you go back to work while you are still breastfeeding, or if you are not able to be present during feedings. Your lactation consultant can help you find a method of pumping that works best for you and give you guidelines about how long it is safe to store breast milk. Caring for your breasts while you breastfeed Nipples can become dry, cracked, and sore while breastfeeding. The following recommendations can help keep your breasts moisturized and healthy:  Avoid using soap on your nipples.  Wear a supportive bra designed especially for nursing. Avoid wearing underwire-style bras or extremely tight bras (sports bras).  Air-dry your nipples for 3-4 minutes after each feeding.  Use only  cotton bra pads to absorb leaked breast milk. Leaking of breast milk between feedings is normal.  Use lanolin on your nipples after breastfeeding. Lanolin helps to maintain your skin's normal moisture barrier. Pure lanolin is not harmful (not toxic) to your baby. You may also hand express a few drops of breast milk and gently massage that milk into your nipples and allow the milk to air-dry. In the first few weeks after giving birth, some women experience breast engorgement. Engorgement can make your breasts feel heavy, warm, and tender to the touch. Engorgement   peaks within 3-5 days after you give birth. The following recommendations can help to ease engorgement:  Completely empty your breasts while breastfeeding or pumping. You may want to start by applying warm, moist heat (in the shower or with warm, water-soaked hand towels) just before feeding or pumping. This increases circulation and helps the milk flow. If your baby does not completely empty your breasts while breastfeeding, pump any extra milk after he or she is finished.  Apply ice packs to your breasts immediately after breastfeeding or pumping, unless this is too uncomfortable for you. To do this: ? Put ice in a plastic bag. ? Place a towel between your skin and the bag. ? Leave the ice on for 20 minutes, 2-3 times a day.  Make sure that your baby is latched on and positioned properly while breastfeeding. If engorgement persists after 48 hours of following these recommendations, contact your health care provider or a lactation consultant. Overall health care recommendations while breastfeeding  Eat 3 healthy meals and 3 snacks every day. Well-nourished mothers who are breastfeeding need an additional 450-500 calories a day. You can meet this requirement by increasing the amount of a balanced diet that you eat.  Drink enough water to keep your urine pale yellow or clear.  Rest often, relax, and continue to take your prenatal vitamins  to prevent fatigue, stress, and low vitamin and mineral levels in your body (nutrient deficiencies).  Do not use any products that contain nicotine or tobacco, such as cigarettes and e-cigarettes. Your baby may be harmed by chemicals from cigarettes that pass into breast milk and exposure to secondhand smoke. If you need help quitting, ask your health care provider.  Avoid alcohol.  Do not use illegal drugs or marijuana.  Talk with your health care provider before taking any medicines. These include over-the-counter and prescription medicines as well as vitamins and herbal supplements. Some medicines that may be harmful to your baby can pass through breast milk.  It is possible to become pregnant while breastfeeding. If birth control is desired, ask your health care provider about options that will be safe while breastfeeding your baby. Where to find more information: [employer]: www.llli.org Contact a health care provider if:  You feel like you want to stop breastfeeding or have become frustrated with breastfeeding.  Your nipples are cracked or bleeding.  Your breasts are red, tender, or warm.  You have: ? Painful breasts or nipples. ? A swollen area on either breast. ? A fever or chills. ? Nausea or vomiting. ? Drainage other than breast milk from your nipples.  Your breasts do not become full before feedings by the fifth day after you give birth.  You feel sad and depressed.  Your baby is: ? Too sleepy to eat well. ? Having trouble sleeping. ? More than 1 week old and wetting fewer than 6 diapers in a 24-hour period. ? Not gaining weight by 5 days of age.  Your baby has fewer than 3 stools in a 24-hour period.  Your baby's skin or the white parts of his or her eyes become yellow. Get help right away if:  Your baby is overly tired (lethargic) and does not want to wake up and feed.  Your baby develops an unexplained fever. Summary  Breastfeeding  offers many health benefits for infant and mothers.  Try to breastfeed your infant when he or she shows early signs of hunger.  Gently tickle or stroke your baby's lips with   your finger or nipple to allow the baby to open his or her mouth. Bring the baby to your breast. Make sure that much of the areola is in your baby's mouth. Offer one side and burp the baby before you offer the other side.  Talk with your health care provider or lactation consultant if you have questions or you face problems as you breastfeed. This information is not intended to replace advice given to you by your health care provider. Make sure you discuss any questions you have with your health care provider. Document Revised: 03/28/2017 Document Reviewed: 02/03/2016 Elsevier Patient Education  2020 Elsevier Inc.  

## 2020-01-19 ENCOUNTER — Other Ambulatory Visit: Payer: Self-pay

## 2020-01-19 ENCOUNTER — Encounter: Payer: Self-pay | Admitting: *Deleted

## 2020-01-19 ENCOUNTER — Ambulatory Visit: Payer: BC Managed Care – PPO | Admitting: *Deleted

## 2020-01-19 ENCOUNTER — Ambulatory Visit (HOSPITAL_BASED_OUTPATIENT_CLINIC_OR_DEPARTMENT_OTHER): Payer: BC Managed Care – PPO

## 2020-01-19 DIAGNOSIS — O365930 Maternal care for other known or suspected poor fetal growth, third trimester, not applicable or unspecified: Secondary | ICD-10-CM

## 2020-01-19 DIAGNOSIS — Z3A35 35 weeks gestation of pregnancy: Secondary | ICD-10-CM

## 2020-01-19 DIAGNOSIS — Z3493 Encounter for supervision of normal pregnancy, unspecified, third trimester: Secondary | ICD-10-CM | POA: Insufficient documentation

## 2020-01-21 ENCOUNTER — Other Ambulatory Visit: Payer: Self-pay

## 2020-01-21 ENCOUNTER — Emergency Department (HOSPITAL_COMMUNITY)
Admission: EM | Admit: 2020-01-21 | Discharge: 2020-01-21 | Discharge status: Absent without leave | Payer: BC Managed Care – PPO

## 2020-01-21 ENCOUNTER — Encounter (HOSPITAL_COMMUNITY): Payer: Self-pay | Admitting: Family Medicine

## 2020-01-21 ENCOUNTER — Inpatient Hospital Stay (HOSPITAL_COMMUNITY)
Admission: RE | Admit: 2020-01-21 | Discharge: 2020-01-23 | DRG: 807 | Disposition: A | Discharge status: Home / Self Care | Payer: BC Managed Care – PPO | Attending: Family Medicine | Admitting: Family Medicine

## 2020-01-21 DIAGNOSIS — Z3493 Encounter for supervision of normal pregnancy, unspecified, third trimester: Secondary | ICD-10-CM

## 2020-01-21 DIAGNOSIS — Z20822 Contact with and (suspected) exposure to covid-19: Secondary | ICD-10-CM | POA: Diagnosis present

## 2020-01-21 DIAGNOSIS — Z3A35 35 weeks gestation of pregnancy: Secondary | ICD-10-CM

## 2020-01-21 DIAGNOSIS — Z9889 Other specified postprocedural states: Secondary | ICD-10-CM | POA: Diagnosis not present

## 2020-01-21 DIAGNOSIS — R42 Dizziness and giddiness: Secondary | ICD-10-CM | POA: Diagnosis present

## 2020-01-21 DIAGNOSIS — O99893 Other specified diseases and conditions complicating puerperium: Secondary | ICD-10-CM | POA: Diagnosis not present

## 2020-01-21 DIAGNOSIS — Z3009 Encounter for other general counseling and advice on contraception: Secondary | ICD-10-CM | POA: Diagnosis not present

## 2020-01-21 LAB — RESP PANEL BY RT-PCR (FLU A&B, COVID) ARPGX2
Influenza A by PCR: NEGATIVE
Influenza B by PCR: NEGATIVE
SARS Coronavirus 2 by RT PCR: NEGATIVE

## 2020-01-21 LAB — TYPE AND SCREEN
ABO/RH(D): A POS
Antibody Screen: NEGATIVE

## 2020-01-21 LAB — CBC
HCT: 33 % — ABNORMAL LOW (ref 36.0–46.0)
Hemoglobin: 10.6 g/dL — ABNORMAL LOW (ref 12.0–15.0)
MCH: 26 pg (ref 26.0–34.0)
MCHC: 32.1 g/dL (ref 30.0–36.0)
MCV: 80.9 fL (ref 80.0–100.0)
Platelets: 189 10*3/uL (ref 150–400)
RBC: 4.08 MIL/uL (ref 3.87–5.11)
RDW: 14.3 % (ref 11.5–15.5)
WBC: 15.7 10*3/uL — ABNORMAL HIGH (ref 4.0–10.5)
nRBC: 0 % (ref 0.0–0.2)

## 2020-01-21 LAB — RAPID HIV SCREEN (HIV 1/2 AB+AG)
HIV 1/2 Antibodies: NONREACTIVE
HIV-1 P24 Antigen - HIV24: NONREACTIVE

## 2020-01-21 MED ORDER — DIPHENHYDRAMINE HCL 25 MG PO CAPS: 25.0000 mg | ORAL_CAPSULE | Freq: Four times a day (QID) | ORAL | Status: DC | PRN

## 2020-01-21 MED ORDER — WITCH HAZEL-GLYCERIN EX PADS
1.0000 "application " | MEDICATED_PAD | CUTANEOUS | Status: DC | PRN
  Administered 2020-01-21: 1 via TOPICAL

## 2020-01-21 MED ORDER — SIMETHICONE 80 MG PO CHEW: 80.0000 mg | CHEWABLE_TABLET | ORAL | Status: DC | PRN

## 2020-01-21 MED ORDER — TETANUS-DIPHTH-ACELL PERTUSSIS 5-2.5-18.5 LF-MCG/0.5 IM SUSY: 0.5000 mL | PREFILLED_SYRINGE | Freq: Once | INTRAMUSCULAR | Status: DC

## 2020-01-21 MED ORDER — ONDANSETRON HCL 4 MG/2ML IJ SOLN: 4.0000 mg | INTRAMUSCULAR | Status: DC | PRN

## 2020-01-21 MED ORDER — DIBUCAINE (PERIANAL) 1 % EX OINT: 1.0000 "application " | TOPICAL_OINTMENT | CUTANEOUS | Status: DC | PRN

## 2020-01-21 MED ORDER — ONDANSETRON HCL 4 MG PO TABS: 4.0000 mg | ORAL_TABLET | ORAL | Status: DC | PRN

## 2020-01-21 MED ORDER — DOCUSATE SODIUM 100 MG PO CAPS
100.0000 mg | ORAL_CAPSULE | Freq: Two times a day (BID) | ORAL | Status: DC
  Administered 2020-01-21 – 2020-01-23 (×4): 100 mg via ORAL
  Filled 2020-01-21 (×4): qty 1

## 2020-01-21 MED ORDER — PRENATAL MULTIVITAMIN CH
1.0000 | ORAL_TABLET | Freq: Every day | ORAL | Status: DC
  Administered 2020-01-22: 1 via ORAL
  Filled 2020-01-21: qty 1

## 2020-01-21 MED ORDER — SENNOSIDES-DOCUSATE SODIUM 8.6-50 MG PO TABS: 2.0000 | ORAL_TABLET | ORAL | Status: DC

## 2020-01-21 MED ORDER — MEASLES, MUMPS & RUBELLA VAC IJ SOLR: 0.5000 mL | Freq: Once | INTRAMUSCULAR | Status: DC

## 2020-01-21 MED ORDER — COCONUT OIL OIL: 1.0000 "application " | TOPICAL_OIL | Status: DC | PRN

## 2020-01-21 MED ORDER — IBUPROFEN 600 MG PO TABS
600.0000 mg | ORAL_TABLET | Freq: Four times a day (QID) | ORAL | Status: DC
  Administered 2020-01-21 – 2020-01-23 (×7): 600 mg via ORAL
  Filled 2020-01-21 (×8): qty 1

## 2020-01-21 MED ORDER — BENZOCAINE-MENTHOL 20-0.5 % EX AERO
1.0000 "application " | INHALATION_SPRAY | CUTANEOUS | Status: DC | PRN
  Administered 2020-01-21: 1 via TOPICAL
  Filled 2020-01-21: qty 56

## 2020-01-21 MED ORDER — ACETAMINOPHEN 325 MG PO TABS: 650.0000 mg | ORAL_TABLET | ORAL | Status: DC | PRN

## 2020-01-21 NOTE — ED Provider Notes (Signed)
MSE was initiated and I personally evaluated the patient and placed orders (if any) at  10:55 AM on January 21, 2020.  The patient appears stable so that the remainder of the MSE may be completed by another provider.  Patient arrives to the emergency department ambulance bay entrance.  She has delivered a very well-appearing child in route with EMS from [facility].  She has not delivered the placenta.  She is feeling well with normal vital signs.  Baby has normal coloration and is in no distress.  Child is attempting to nurse.  Called the MAU.  No provider available but spoke with nursing staff there.  They are expecting the patient and will accept to the MAU.  Patient stable for transport from the ED.    Maia Plan, MD 01/21/20 1056

## 2020-01-21 NOTE — Discharge Summary (Signed)
Postpartum Discharge Summary     Patient Name: Monica Ali DOB: 05-08-2000 MRN: [id number]  Date of admission: 01/21/2020 Delivery date:01/21/2020  Delivering provider:   Date of discharge: 01/23/2020  Admitting diagnosis: Preterm delivery, delivered [O60.10X0] Intrauterine pregnancy: [redacted]w[redacted]d    Secondary diagnosis:  Active Problems:   Preterm delivery   Preterm delivery, delivered  Additional problems: None   Discharge diagnosis: Preterm Pregnancy Delivered                                              Post partum procedures:None Augmentation: N/A Complications: None  Hospital course: Onset of Labor With Vaginal Delivery      20y.o. yo G1P0101 at 333w5das admitted after precipitous delivery in ambulance on 01/21/2020. Patient had an uncomplicated labor course as follows:  Membrane Rupture Time/Date:  ,   Delivery Method:Vaginal, Spontaneous  Episiotomy: None  Lacerations:  Labial;Vaginal  Patient had an uncomplicated postpartum course.  She is ambulating, tolerating a regular diet, passing flatus, and urinating well. Patient is discharged home in stable condition on 01/23/20.  Newborn Data: Birth date:01/21/2020  Birth time:10:15 AM  Gender:Female  Living status:Living  Apgars: ,  [id number]   Magnesium Sulfate received: No BMZ received: No Rhophylac:N/A MMR:N/A T-DaP:Given prenatally Flu: Yes Transfusion:No  Physical exam  Vitals:   01/22/20 0200 01/22/20 1546 01/23/20 0100 01/23/20 0700  BP: (!) 97/57 (!) 104/55 114/70 114/79  Pulse: (!) 57 64 81 83  Resp: '16 20 20 18  ' Temp: 98.6 F (37 C) 98.5 F (36.9 C) 97.8 F (36.6 C) 97.9 F (36.6 C)  TempSrc: Oral Oral Oral Oral  SpO2: 98% 97% 98%   Weight:      Height:       General: alert, cooperative and no distress Lochia: appropriate Uterine Fundus: firm Incision: N/A DVT Evaluation: No evidence of DVT seen on physical exam. Labs: Lab Results  Component Value Date   WBC 15.7 (H) 01/21/2020   HGB  10.6 (L) 01/21/2020   HCT 33.0 (L) 01/21/2020   MCV 80.9 01/21/2020   PLT 189 01/21/2020   CMP Latest Ref Rng & Units 11/29/2019  Glucose 70 - 99 mg/dL 91  BUN 6 - 20 mg/dL 11  Creatinine 0.44 - 1.00 mg/dL 0.71  Sodium 135 - 145 mmol/L 136  Potassium 3.5 - 5.1 mmol/L 4.0  Chloride 98 - 111 mmol/L 106  CO2 22 - 32 mmol/L 24  Calcium 8.9 - 10.3 mg/dL 8.7(L)  Total Protein 6.5 - 8.1 g/dL 6.4(L)  Total Bilirubin 0.3 - 1.2 mg/dL 0.2(L)  Alkaline Phos 38 - 126 U/L 71  AST 15 - 41 U/L 12(L)  ALT 0 - 44 U/L 16   Edinburgh Score: Edinburgh Postnatal Depression Scale Screening Tool 01/21/2020  I have been able to laugh and see the funny side of things. 0  I have looked forward with enjoyment to things. 0  I have blamed myself unnecessarily when things went wrong. 0  I have been anxious or worried for no good reason. 1  I have felt scared or panicky for no good reason. 0  Things have been getting on top of me. 1  I have been so unhappy that I have had difficulty sleeping. 0  I have felt sad or miserable. 0  I have been so unhappy that I have been crying. 0  The thought of harming myself has occurred to me. 0  Edinburgh Postnatal Depression Scale Total 2     After visit meds:  Allergies as of 01/23/2020   No Known Allergies     Medication List    TAKE these medications   acetaminophen 325 MG tablet Commonly known as: TYLENOL Take 650 mg by mouth every 6 (six) hours as needed.   Blood Pressure Kit Devi 1 Device by Does not apply route as needed.   ibuprofen 800 MG tablet Commonly known as: ADVIL Take 1 tablet (800 mg total) by mouth every 8 (eight) hours as needed.   multivitamin-prenatal 27-0.8 MG Tabs tablet Take 1 tablet by mouth daily at 12 noon.        Discharge home in stable condition Infant Feeding: Breast Infant Disposition:home with mother Discharge instruction: per After Visit Summary and Postpartum booklet. Activity: Advance as tolerated. Pelvic rest for 6  weeks.  Diet: routine diet Future Appointments: Future Appointments  Date Time Provider [place]  03/04/2020  9:15 AM Luvenia Redden, PA-C [facility]   Follow up Visit:  [place] [facility] [place] at [facility] Follow up in 6 week(s).   Specialty: Obstetrics and Gynecology Contact information: [place] [address] [phone]               Please schedule this patient for a In person postpartum visit in 6 weeks with the following provider: Any provider. Additional Postpartum F/U:none  Low risk pregnancy complicated by: precipitous delivery Delivery mode:  Vaginal, Spontaneous  Anticipated Birth Control:  POPs   Marcille Buffy DNP, CNM  01/23/20  10:16 AM

## 2020-01-21 NOTE — H&P (Signed)
OBSTETRIC ADMISSION HISTORY AND PHYSICAL  Monica Ali is a 20 y.o. female G1P0 with IUP at 103w5dby LMP presenting after delivery via EMS. Patient delivered en route to hospital in the ambulance. Per EMS report patient was complete and pushed approximately six times in ambulance to deliver viable female infant. Placenta still in utero on arrival to L&D.    She plans on breast feeding. She request POPs for birth control. [facility]  Dating: By LMP --->  Estimated Date of Delivery: 02/20/20   Prenatal History/Complications:  -FGR w EFW 4.5%ile at 343w6dPast Medical History: Past Medical History:  Diagnosis Date  . Back pain   . Depression   . Scoliosis     Past Surgical History: Past Surgical History:  Procedure Laterality Date  . WRIST SURGERY  2019    Obstetrical History: OB History    Gravida  1   Para      Term      Preterm      AB      Living        SAB      IAB      Ectopic      Multiple      Live Births              Social History Social History   Socioeconomic History  . Marital status: Single    Spouse name: Not on file  . Number of children: Not on file  . Years of education: Not on file  . Highest education level: Not on file  Occupational History  . Not on file  Tobacco Use  . Smoking status: Never Smoker  . Smokeless tobacco: Never Used  Vaping Use  . Vaping Use: Former  Substance and Sexual Activity  . Alcohol use: Not Currently  . Drug use: Not Currently    Types: Marijuana    Comment: a month   . Sexual activity: Yes    Birth control/protection: None  Other Topics Concern  . Not on file  Social History Narrative  . Not on file   Social Determinants of Health   Financial Resource Strain: Not on file  Food Insecurity: No Food Insecurity  . Worried About RuCharity fundraisern the Last Year: Never true  . Ran Out of Food in the Last Year: Never true  Transportation Needs: No  Transportation Needs  . Lack of Transportation (Medical): No  . Lack of Transportation (Non-Medical): No  Physical Activity: Not on file  Stress: Not on file  Social Connections: Not on file    Family History: Family History  Problem Relation Age of Onset  . Breast cancer Mother   . Bipolar disorder Mother   . Cancer Mother        breast cancer  . Arthritis Father   . Ovarian cancer Maternal Aunt     Allergies: No Known Allergies  Medications Prior to Admission  Medication Sig Dispense Refill Last Dose  . acetaminophen (TYLENOL) 325 MG tablet Take 650 mg by mouth every 6 (six) hours as needed.     . Blood Pressure Monitoring (BLOOD PRESSURE KIT) DEVI 1 Device by Does not apply route as needed. 1 each 0   . Prenatal Vit-Fe Fumarate-FA (MULTIVITAMIN-PRENATAL) 27-0.8 MG TABS tablet Take 1 tablet by mouth daily at 12 noon. 30 tablet 11      Review of Systems   All systems reviewed and negative except  as stated in HPI  Last menstrual period 05/13/2019. General appearance: alert and cooperative, well appearing Lungs: normal effort  Abdomen: soft, non-tender; bowel sounds normal Pelvic: placenta still in place, vaginal exam notable for one right labial laceration, one right vaginal laceration, hemostatic not requiring repair Extremities: Homans sign is negative, no sign of DVT   Prenatal labs: ABO, Rh: A/Positive/-- (08/04 9935) Antibody: Negative (08/04 0952) Rubella: 3.59 (08/04 0952) RPR: Non Reactive (11/16 0914)  HBsAg: Negative (08/04 0952)  HIV: Non Reactive (11/16 0914)  GBS:   unknown  2 hr Glucola normal Genetic screening  normal Anatomy US normal   Prenatal Transfer Tool  Maternal Diabetes: No Genetic Screening: Normal Maternal Ultrasounds/Referrals: Normal Fetal Ultrasounds or other Referrals:  None Maternal Substance Abuse:  No Significant Maternal Medications:  None Significant Maternal Lab Results: Other: gbs unknown   No results found for this or  any previous visit (from the past 24 hour(s)).  Patient Active Problem List   Diagnosis Date Noted  . [redacted] weeks gestation of pregnancy 12/29/2019  . IUGR (intrauterine growth restriction) affecting care of mother 12/29/2019  . Supervision of low-risk pregnancy 08/12/2019  . Scoliosis 03/12/2011    Assessment/Plan:  Monica Ali is a 20 y.o. G1P0 at 40w5ds/p precipitous delivery in ambulance.   S/p Spontaneous Vaginal Delivery in Ambulance:  -placenta delivered without complication on L&D, fundus firm with minimal blood loss. On inspection she has one right labial laceration and right vaginal laceration, hemostatic and not requiring repair - continue routine postpartum care  #MOC:POP's #Circ:  N/a  JJanet Berlin MD  01/21/2020, 11:07 AM

## 2020-01-21 NOTE — Lactation Note (Signed)
This note was copied from a baby's chart. Lactation Consultation Note  Patient Name: Monica Ali Date: 01/21/2020 Reason for consult: Initial assessment;Late-preterm 34-36.6wks;Primapara;Other (Comment);Infant < 6lbs (IUGR/SGA?) Age:21 hours  This baby was not seen in L/D.  Mom delivered in ambulance. Parents seem a little overwhelmed at this time. Baby Monica [name] cuing on arrival.  [facility] assisted with latching and feeding in cross cradle hold.  Dad took over.  Dad did great helping mom keep [name] in close.  [name] breastfed about 10 minutes.  Then LC assisted dad in feeding her 7 ml of Similac Neosure.  Mom reports she has a breastpump for home use. Mom reports someone purchased it for her has no idea about it.  Did not inquire about WIC at this time.  Visitors in the room. Urged mom to contact her insurance company in regards to a DEBP just in case the one purchased for her was not. LC reviewed LPTI guidelines and gave green sheet. Urged to feed on cue and at least 8-12 times day.  Urged parents not to make the baby wait to feed. Initiated pumping with DEBP with mom.  Mom reports her nipples are usually tender so gave mom coconut oil to use with pumping. Dad assisted mom with pumping and holding the flanges.  Urged mom to try and pump every three hours and to try and pump past a breastfeeding and or an attempted breastfeed.  Urged mom to feed back all expressed mothers milk and formula as needed to make recommended amount.  Urged parents to feed her more if she wants more. Praised mom decision to breastfeed.  Urged to call lactation as needed.    Maternal Data Has patient been taught Hand Expression?: Yes Does the patient have breastfeeding experience prior to this delivery?: No  Feeding   Feeding Type: Breast Fed  LATCH Score Latch: Grasps breast easily, tongue down, lips flanged, rhythmical sucking.  Audible Swallowing: A few with stimulation  Type of Nipple: Everted at  rest and after stimulation  Comfort (Breast/Nipple): Soft / non-tender (mom reprots nipples are so sore did not want to do STS in ambulance)  Hold (Positioning): Assistance needed to correctly position infant at breast and maintain latch.  LATCH Score: 8  Interventions Interventions: Breast feeding basics reviewed;Assisted with latch;Skin to skin;Adjust position;Hand express;DEBP  Lactation Tools Discussed/Used Tools: Pump;Bottle Breast pump type: Double-Electric Breast Pump;Manual Pump Education: Setup, frequency, and cleaning;Milk Storage Initiated by:: LC Date initiated:: 01/21/20   Consult Status Consult Status: Follow-up Date: 01/21/20 Follow-up type: In-patient    [facility] Michaelle Copas 01/21/2020, 6:17 PM

## 2020-01-22 DIAGNOSIS — Z3009 Encounter for other general counseling and advice on contraception: Secondary | ICD-10-CM

## 2020-01-22 LAB — RPR: RPR Ser Ql: NONREACTIVE

## 2020-01-22 NOTE — Lactation Note (Signed)
This note was copied from a baby's chart. Lactation Consultation Note  Patient Name: Girl Osie Merkin Today's Date: 01/22/2020 Age:21 hours  Attempted LC visit P1 of 31 hours old infant currently in NICU. Parents are not in room at the time of visit. LC will come back to room at another time as possible.     Daquavion Catala A Higuera Ancidey 01/22/2020, 5:15 PM

## 2020-01-22 NOTE — Clinical Social Work Maternal (Signed)
CLINICAL SOCIAL WORK MATERNAL/CHILD NOTE  Patient Details  Name: Monica Ali MRN: [id number] Date of Birth: 07/05/2000  Date:  03-08-2020  Clinical Social Worker Initiating Note:  Montel Clock Monee Dembeck LCSW Date/Time: Initiated:  01/22/20/1045     Child's Name:  Monica Ali   Biological Parents:  Mother,Father (Monica Ali, Monica Ali)   Need for Interpreter:  None   Reason for Referral:  Current Substance Use/Substance Use During Pregnancy ,Behavioral Health Concerns   Address:  [address] [place] [address]    Phone number:  [phone] (home)     Additional phone number: none   Household Members/Support Persons (HM/SP):   Household Member/Support Person 1,Household Member/Support Person 2   HM/SP Name Relationship DOB or Age  HM/SP -1  Monica Ali MOB 10/[birth year]/1999  HM/SP -2 Monica Ali FOB  02/22/1997  HM/SP -3        HM/SP -4        HM/SP -5        HM/SP -6        HM/SP -7        HM/SP -8          Natural Supports (not living in the home):  Parent,Immediate Family   Professional Supports: None   Employment: Environmental education officer   Type of Work: Works at [employer]   Education:    Some College   Homebound arranged:  n/a  Surveyor, quantity Resources:   BCBS  Other Resources:    none desired.   Cultural/Religious Considerations Which May Impact Care:  none reported.   Strengths:  Ability to meet basic needs ,Compliance with medical plan ,Home prepared for child ,Pediatrician chosen   Psychotropic Medications:    none reported.      Pediatrician:     [facility] [facility]  Pediatrician List:   [facility]    [place]    [place]    [place] [facility]      Pediatrician Fax Number:    Risk Factors/Current Problems:  None   Cognitive State:  Insightful ,Able to Concentrate ,Alert    Mood/Affect:  Relaxed ,Calm ,Interested ,Happy    CSW Assessment: CSW consulted  due to [facility] reporting THC use, Transportation needs, as well as depression. CSW went to speak with MOB at bedside to address further needs.   CSW congratulated MOB on the birth of infant. CSW advised MOB of the HIPPA policy and received permission to have FOB step out of room while CSW spoke with her. CSW then advised MOB of CSW's  role and the reason for CSW coming to speak with her. MOB expressed that she was never diagnosed with depression but "just knows that I have it". MOB expressed that she has never been on any medication nor in therapy for her depression. MOB expressed that she has had minimal challenges with her depression and expressed that she has been feeling well since she gave birth. MOB reported no other mental health hx and denies SI, HI and DV.   CSW inquired from [facility] on her THC use while pregnant. MOB expressed that she used CBD Gummies in her first trimester. MOB reported challenges with eating and expressed that the use of the CBD Gummies would increase her appetite. MOB reported that after she spoke with her OB provider she stopped taking them. CSW understanding and still advised MOB of the [place] drug screen policy. MOB was advised that if  infants UDS or CDS returns positive then CSW would need to make a CPS report. MOB expressed that she understood and expressed no other substance use. MOB did inquire from CSW on "would infants urine be positive if dad used?". MOB advised CSW that dad uses CBD Sticks and that she was around it while pregnant. CSW advised MOB that CSW is unsure however again if positive then CSW would need to make a CPS report. MOB denies having and previous or current CPS hx with herself but does report that her mother has CPS hx for her.   CSW was advised that MOB has support from her dad as well as FOB. MOB reported that she has all needed items to care for infant with plans for infant to sleep in basinet once arrived home. MOB expressed that she has all needed items  to care for infant and that she gets WIC. MOB reported that she has a car however it is being worked on at this time. MOB indicated that she can get FOB's mom or dad or her dad to take her to appointments as needed.   CSW provided MOB with PPD and SIDS education. MOB was given PPD Checklist in order to keep track of feelings as they may relate to PPD. MOB thanked CSW and expressed no other needs.   CSW will continue to monitor infants UDS and CDS and make CPS report if warranted.   CSW Plan/Description:  No Further Intervention Required/No Barriers to Discharge,Sudden Infant Death Syndrome (SIDS) Education,Perinatal Mood and Anxiety Disorder (PMADs) Education,CSW Will Continue to Monitor Umbilical Cord Tissue Drug Screen Results and Make Report if Warranted,[place] Drug Screen Policy Information    Naheim Burgen S Dominick Zertuche, LCSWA 01/22/2020, 11:17 AM  

## 2020-01-22 NOTE — Lactation Note (Signed)
This note was copied from a baby's chart. Lactation Consultation Note  Patient Name: Monica Ali OHKGO'V Date: 01/22/2020 Reason for consult: Follow-up assessment;NICU baby Age:21 hours  Follow up with P1 mother of 85 hours old infant currently in NICU. Parents are in the room at the time of visit. Father of infant states mother has not pumped since yesterday. Mother indicated she will resume pumping later today.  LC urged mother to resume pumping as soon as possible. Explained the importance of stimulation and removal of milk to establish a good milk supply since mother's main goal is to breastfeed. Reinforced the need to pump at least 8 times in a 24 hour period.  Discussed the availability to set up a pump by infant's beside to support pumping efforts.  All questions answered at this time.   Feeding Feeding Type: Formula Nipple Type: Nfant Slow Flow (purple)  Interventions Interventions: Breast feeding basics reviewed;Expressed milk;DEBP  Consult Status Consult Status: Follow-up Date: 01/23/20 Follow-up type: In-patient    Monica Ali 01/22/2020, 7:48 PM

## 2020-01-22 NOTE — Progress Notes (Signed)
Post Partum Day 1 Subjective: no complaints, up ad lib, voiding and tolerating PO, small lochia, plans to breastfeed, oral progesterone-only contraceptive  Objective: Blood pressure (!) 97/57, pulse (!) 57, temperature 98.6 F (37 C), temperature source Oral, resp. rate 16, height 5\' 5"  (1.651 m), weight 91.6 kg, last menstrual period 05/13/2019, SpO2 98 %, unknown if currently breastfeeding.  Physical Exam:  General: alert, cooperative and no distress Lochia:normal flow Chest: CTAB Heart: RRR no m/r/g Abdomen: +BS, soft, nontender,  Uterine Fundus: firm DVT Evaluation: No evidence of DVT seen on physical exam. Extremities: no edema  Recent Labs    01/21/20 1700  HGB 10.6*  HCT 33.0*    Assessment/Plan: Breastfeeding, Lactation consult and Contraception POPs   LOS: 1 day   03/20/20 01/22/2020, 7:21 AM

## 2020-01-23 ENCOUNTER — Other Ambulatory Visit: Payer: Self-pay

## 2020-01-23 ENCOUNTER — Inpatient Hospital Stay (HOSPITAL_COMMUNITY)
Admission: AD | Admit: 2020-01-23 | Discharge: 2020-01-23 | Disposition: A | Discharge status: Home / Self Care | Payer: BC Managed Care – PPO | Attending: Family Medicine | Admitting: Family Medicine

## 2020-01-23 ENCOUNTER — Encounter (HOSPITAL_COMMUNITY): Payer: Self-pay | Admitting: Family Medicine

## 2020-01-23 DIAGNOSIS — O99893 Other specified diseases and conditions complicating puerperium: Secondary | ICD-10-CM

## 2020-01-23 DIAGNOSIS — Z9889 Other specified postprocedural states: Secondary | ICD-10-CM

## 2020-01-23 DIAGNOSIS — R42 Dizziness and giddiness: Secondary | ICD-10-CM | POA: Diagnosis not present

## 2020-01-23 LAB — GLUCOSE, CAPILLARY: Glucose-Capillary: 82 mg/dL (ref 70–99)

## 2020-01-23 MED ORDER — FERROUS SULFATE 325 (65 FE) MG PO TABS: 325.0000 mg | ORAL_TABLET | ORAL | 1 refills | Status: AC

## 2020-01-23 MED ORDER — IBUPROFEN 800 MG PO TABS: 800.0000 mg | ORAL_TABLET | Freq: Three times a day (TID) | ORAL | 0 refills | Status: DC | PRN

## 2020-01-23 NOTE — Lactation Note (Signed)
This note was copied from a baby's chart. Lactation Consultation Note  Patient Name: Monica Ali UMPNT'I Date: 01/23/2020 Reason for consult: NICU baby;Mother's request Age:21 hours  LC returned to assist at 1100 feeding. Infant with low temp and sleeping soundly. Did not wake for feeding. LC assisted with placing sts for gavage feeding. Reviewed with parents that this is normal behavior for a preterm infant. LC encouraged mom to pump q 3 hours to bring in supply until baby is ready to bf. Will continue to support bf.   Consult Status Consult Status: Follow-up Date: 01/23/20 Follow-up type: In-patient   Elder Negus, MA IBCLC 01/23/2020, 11:18 AM

## 2020-01-23 NOTE — MAU Note (Signed)
Monica Ali 2 days post partum reports to MAU reporting dizziness. Patient states throughout the day she has felt the ground "wobbly and uneven" and it has gotten worse. She has not had this feeling before. Denies pain. Some vaginal spotting.

## 2020-01-23 NOTE — MAU Provider Note (Signed)
History     CSN: [id number]  Arrival date and time: 01/23/20 1920   Event Date/Time   First Provider Initiated Contact with Patient 01/23/20 2023      Chief Complaint  Patient presents with  . Dizziness   Monica Ali is a 20 y.o. G1P0101 at 2 Days Postpartum s/p SVD.  She presents today for Dizziness. She reports she was having dizziness "earlier today around the time I was getting discharged."  She states she was "slightly dizzy" and "when I went to the cafeteria to get a bag of chips it felt like the floor was gonna fall through." She reports she last ate around 7pm and had not eaten for 4-5 hours prior to that... patient goes on to state it was around 1020am.  She reports she has been in the NICU on and off throughout the day and was discharged around 2pm.  She reports she has gotten about 12-15 hours of sleep total over the past couple of days.   Veggie Lasagna/Brocc: 7pm Chicken Sandwich/Chips/Avocado Dip: 1530pm [place] Toast, Berniece Salines, Bagel, Cereal: 10am   OB History    Gravida  1   Para  1   Term      Preterm  1   AB      Living  1     SAB      IAB      Ectopic      Multiple  0   Live Births  1           Past Medical History:  Diagnosis Date  . Back pain   . Depression   . Scoliosis     Past Surgical History:  Procedure Laterality Date  . WRIST SURGERY  2019    Family History  Problem Relation Age of Onset  . Breast cancer Mother   . Bipolar disorder Mother   . Cancer Mother        breast cancer  . Arthritis Father   . Ovarian cancer Maternal Aunt     Social History   Tobacco Use  . Smoking status: Never Smoker  . Smokeless tobacco: Never Used  Vaping Use  . Vaping Use: Former  Substance Use Topics  . Alcohol use: Not Currently  . Drug use: Not Currently    Types: Marijuana    Comment: a month     Allergies: No Known Allergies  Medications Prior to Admission  Medication Sig Dispense Refill Last Dose  . ibuprofen (ADVIL)  800 MG tablet Take 1 tablet (800 mg total) by mouth every 8 (eight) hours as needed. 30 tablet 0 01/23/2020 at Unknown time  . Prenatal Vit-Fe Fumarate-FA (MULTIVITAMIN-PRENATAL) 27-0.8 MG TABS tablet Take 1 tablet by mouth daily at 12 noon. 30 tablet 11 01/22/2020 at Unknown time  . acetaminophen (TYLENOL) 325 MG tablet Take 650 mg by mouth every 6 (six) hours as needed.     . Blood Pressure Monitoring (BLOOD PRESSURE KIT) DEVI 1 Device by Does not apply route as needed. 1 each 0     Review of Systems  Constitutional: Negative for chills and fever.  Respiratory: Negative for cough and shortness of breath.   Gastrointestinal: Negative for nausea and vomiting.  Genitourinary: Positive for vaginal bleeding. Negative for difficulty urinating.  Neurological: Positive for dizziness, light-headedness and headaches (1/10-"Buzzy, just barely noticeable").   Physical Exam   Blood pressure 127/82, pulse 76, temperature 98.6 F (37 C), temperature source Oral, resp. rate 18, height '5\' 5"'  (1.651 m),  weight 90.8 kg, SpO2 98 %, unknown if currently breastfeeding.  Physical Exam Vitals and nursing note reviewed.  Constitutional:      Appearance: Normal appearance.  HENT:     Head: Normocephalic and atraumatic.  Eyes:     Conjunctiva/sclera: Conjunctivae normal.  Cardiovascular:     Rate and Rhythm: Normal rate and regular rhythm.     Heart sounds: Normal heart sounds.  Pulmonary:     Effort: Pulmonary effort is normal. No respiratory distress.     Breath sounds: Normal breath sounds.  Abdominal:     Palpations: Abdomen is soft.     Tenderness: There is no abdominal tenderness.  Musculoskeletal:        General: Normal range of motion.     Cervical back: Normal range of motion.  Skin:    General: Skin is warm and dry.  Neurological:     Mental Status: She is alert and oriented to person, place, and time.  Psychiatric:        Mood and Affect: Mood normal.        Behavior: Behavior normal.         Thought Content: Thought content normal.     MAU Course  Procedures Results for orders placed or performed during the hospital encounter of 01/23/20 (from the past 24 hour(s))  Glucose, capillary     Status: None   Collection Time: 01/23/20  8:29 PM  Result Value Ref Range   Glucose-Capillary 82 70 - 99 mg/dL   MDM Exam Education CBG Assessment and Plan  20 year old 2 Days Postpartum Dizziness  -Exam performed -CBG completed. -Reviewed results and provider concern that patient is not consuming enough protein throughout the day.  -Discussed hypoglycemia and how it can contribute to feelings of dizziness. -Reviewed high protein snacks between meals as needed. -Discussed collection of CBC to evaluate iron level.  Informed of previous level as low, but manageable.  Patient expresses fear of needles. Given option of starting limited iron supplementation and agrees. -Rx for iron supplement sent to pharmacy on file for QOD usage. -Encouraged rest when possible. -Encouraged to call or return to MAU if symptoms worsen or with the onset of new symptoms. -Discharged to home in stable condition.   Monica Ali 01/23/2020, 8:23 PM

## 2020-01-23 NOTE — Lactation Note (Signed)
This note was copied from a baby's chart. Lactation Consultation Note  Patient Name: Monica Ali YBFXO'V Date: 01/23/2020 Reason for consult: Follow-up assessment;Mother's request;1st time breastfeeding;NICU baby;Late-preterm 34-36.6wks Age:20 years  LC to room at RN request to assist with bf. Per mom and RN, baby was cuing prior to [facility] arrival. Baby was sleeping at time of visit and did not wake with gentle pestering. Baby was receiving gavage during visit. LC left mom and baby sts and offered to return prior to the 1100 feeding to assist prn. Will plan to f/u at next feeding attempt. Mother was provided with the opportunity to ask questions. All concerns were addressed.    Consult Status Consult Status: Follow-up Date: 01/23/20 Follow-up type: In-patient   Elder Negus, MA IBCLC 01/23/2020, 9:19 AM

## 2020-01-23 NOTE — Lactation Note (Signed)
This note was copied from a baby's chart. Lactation Consultation Note  Patient Name: Monica Ali AXENM'M Date: 01/23/2020 Reason for consult: Follow-up assessment Age:21 hours   LC Follow Up Visit:  Attempted to visit with mother, however, she is not in her room.  Will follow up later today.   Maternal Data    Feeding Feeding Type: Formula  LATCH Score                   Interventions    Lactation Tools Discussed/Used     Consult Status Consult Status: Follow-up Date: 01/23/20 Follow-up type: In-patient    Samar Venneman R Oziel Beitler 01/23/2020, 8:22 AM

## 2020-01-23 NOTE — Discharge Instructions (Signed)
Dizziness °Dizziness is a common problem. It is a feeling of unsteadiness or light-headedness. You may feel like you are about to faint. Dizziness can lead to injury if you stumble or fall. Anyone can become dizzy, but dizziness is more common in older adults. This condition can be caused by a number of things, including medicines, dehydration, or illness. °Follow these instructions at home: °Eating and drinking °· Drink enough fluid to keep your urine clear or pale yellow. This helps to keep you from becoming dehydrated. Try to drink more clear fluids, such as water. °· Do not drink alcohol. °· Limit your caffeine intake if told to do so by your health care provider. Check ingredients and nutrition facts to see if a food or beverage contains caffeine. °· Limit your salt (sodium) intake if told to do so by your health care provider. Check ingredients and nutrition facts to see if a food or beverage contains sodium. °Activity °· Avoid making quick movements. °? Rise slowly from chairs and steady yourself until you feel okay. °? In the morning, first sit up on the side of the bed. When you feel okay, stand slowly while you hold onto something until you know that your balance is fine. °· If you need to stand in one place for a long time, move your legs often. Tighten and relax the muscles in your legs while you are standing. °· Do not drive or use heavy machinery if you feel dizzy. °· Avoid bending down if you feel dizzy. Place items in your home so that they are easy for you to reach without leaning over. °Lifestyle °· Do not use any products that contain nicotine or tobacco, such as cigarettes and e-cigarettes. If you need help quitting, ask your health care provider. °· Try to reduce your stress level by using methods such as yoga or meditation. Talk with your health care provider if you need help to manage your stress. °General instructions °· Watch your dizziness for any changes. °· Take over-the-counter and  prescription medicines only as told by your health care provider. Talk with your health care provider if you think that your dizziness is caused by a medicine that you are taking. °· Tell a friend or a family member that you are feeling dizzy. If he or she notices any changes in your behavior, have this person call your health care provider. °· Keep all follow-up visits as told by your health care provider. This is important. °Contact a health care provider if: °· Your dizziness does not go away. °· Your dizziness or light-headedness gets worse. °· You feel nauseous. °· You have reduced hearing. °· You have new symptoms. °· You are unsteady on your feet or you feel like the room is spinning. °Get help right away if: °· You vomit or have diarrhea and are unable to eat or drink anything. °· You have problems talking, walking, swallowing, or using your arms, hands, or legs. °· You feel generally weak. °· You are not thinking clearly or you have trouble forming sentences. It may take a friend or family member to notice this. °· You have chest pain, abdominal pain, shortness of breath, or sweating. °· Your vision changes. °· You have any bleeding. °· You have a severe headache. °· You have neck pain or a stiff neck. °· You have a fever. °These symptoms may represent a serious problem that is an emergency. Do not wait to see if the symptoms will go away. Get medical help   right away. Call your local emergency services (911 in the [place] Do not drive yourself to the hospital. Summary  Dizziness is a feeling of unsteadiness or light-headedness. This condition can be caused by a number of things, including medicines, dehydration, or illness.  Anyone can become dizzy, but dizziness is more common in older adults.  Drink enough fluid to keep your urine clear or pale yellow. Do not drink alcohol.  Avoid making quick movements if you feel dizzy. Monitor your dizziness for any changes. This information is not intended to  replace advice given to you by your health care provider. Make sure you discuss any questions you have with your health care provider. Document Revised: 01/04/2017 Document Reviewed: 02/04/2016 Elsevier Patient Education  2020 [employer].  Breastfeeding Your Premature or Sick Baby Breast milk is the best food for your baby, especially if your baby is premature or sick. Some benefits of breastfeeding include:  Complete nutrition. Breast milk contains all the nutrition and germ-fighting substances that your baby needs.  Reduced risk of health conditions later in your baby's life, such as asthma, obesity, and digestive diseases. However, many premature or sick babies are not ready to breastfeed after they are born. There are several things you can do to make sure your baby gets the best nutrition possible. How does this affect me? Until your baby's health care provider thinks that your baby is ready to breastfeed, you may need to:  Pump (express) your milk using either a breast pump or your hands. Pumped milk can be delivered by tube to your baby (tube feeding). ? During the first few days after you give birth, pumped milk contains colostrum and can be given in small amounts to help your baby's immune system (oral immune therapy). ? Pumped milk can be stored in the freezer and saved for future use.  Start with practice (non-nutritive) breastfeeding. This means putting your baby to your breast, even if he or she cannot get milk from your breast. These practices help:  Provide your baby with the nutrition he or she needs.  Stimulate your baby's digestion.  Strengthen your baby's tongue and mouth muscles needed for breastfeeding.  Encourage bonding between you and your baby. How does this affect my baby? Nutrition Premature babies may have additional nutritional needs compared to babies who are born at full term. If your baby is too small or sick to breastfeed, he or she may need  to:  Get tube feedings.  Have calories or nutrients added to breast milk. This can be done by supplementing with infant formula or with donated breast milk from a milk bank. Feeding Premature infants need extra support for the neck and head when feeding. When your baby is able to breastfeed, the following positions may work best:  Football hold. Place a pillow on your lap on the side of your body from which you are planning to breastfeed. Hold your baby's head just below the ears, at the base of the neck, so his or her head and neck are supported by your hand. Use your forearm to support the shoulders and spine. Tuck your baby's legs between your arm and your body. Bring your baby to the breast on the same side of your body as the arm you are holding him or her with.  Cross-cradle hold. This involves laying your baby across your lap on a pillow at breast height. Hold your baby's head just below the ears, at the base of the neck, so his  or her head and neck are supported by your hand. Use your other hand to support your breast. Bring your baby to the breast on the opposite side of your body as the arm you are holding him or her with.  Follow these instructions in the hospital and at home:   Breastfeed within a couple hours after you deliver your baby. This will help to express your first milk called colostrum. If you delay milk expression, or if you do not express milk every few hours, it may take longer to increase your milk supply.  If you are not able to breastfeed, pump as soon as possible after birth. To do this: ? Wash your hands with soap and water before pumping breast milk. ? Massage your breast from the top of your breast and stroke toward your nipple. This helps to improve your let-down reflex. ? Pump frequently to help initiate and build your milk supply. Avoid going more than 4-6 hours without pumping. ? Pump milk into clean bottles or storage containers. ? If using an electric  pump, pump both breasts at the same time and empty your breasts every time you pump.  As your baby is learning to breastfeed, pump after feedings to empty your breasts. This will help maintain your milk supply.  When your baby is ready to breastfeed, learn his or her hunger cues, such as: ? Stirring from sleep. ? Opening eyes. ? Turning his or her head toward a touch on the cheek. ? Poking his or her tongue out. ? Making cooing noises. ? Sucking on his or her lips. ? Bringing hands to the mouth. ? Starting to whine.  If your baby is crying, soothe your baby by placing him or her between your breasts, skin-to-skin. Once your baby is calm, try to breastfeed.  Rest and drink plenty of fluids.  Talk with your health care provider or lactation specialist about when supplemental feedings can be stopped after breastfeeding. Questions to ask your lactation specialist  How do I hand express milk?  What type or brand of breast pump will work best for me?  How should I feed my baby using a bottle?  How should I feed my baby using a tube feeding system?  How should I store pumped breast milk?  How should I position my baby for breastfeeding?  How long does my baby need supplemental feedings? Contact a health care provider if:  Your baby is not wetting or soiling diapers as often as your health care provider told you to expect.  Your baby vomits.  You notice a reddened area on your breast, or you feel sick.  Your milk supply starts to decrease.  You feel pain or itchiness on your breasts. Summary  Breast milk is the best nutrition for premature or sick babies.  Pumping your breasts will help establish your milk supply when your baby is not able to breastfeed.  Breastfeed within a couple hours after you deliver your baby. This will help to express your first milk called colostrum. Even if your baby is not able to get any milk, this can help build your baby's digestive system and  oral muscles, and promote bonding between you and your baby. This information is not intended to replace advice given to you by your health care provider. Make sure you discuss any questions you have with your health care provider. Document Revised: 04/23/2018 Document Reviewed: 01/02/2017 Elsevier Patient Education  2020 [employer].

## 2020-01-24 ENCOUNTER — Ambulatory Visit: Payer: Self-pay

## 2020-01-24 NOTE — Lactation Note (Signed)
This note was copied from a baby's chart. Lactation Consultation Note  Patient Name: Monica Ali [id number] Date: 01/24/2020 Reason for consult: Follow-up assessment;Primapara;1st time breastfeeding;Late-preterm 34-36.6wks;Infant < 6lbs Age:21 hours   RN requested [facility] consult with Mom.  Mom holding baby on her chest.  Baby 4 lbs. 10.4 oz today and AGA [redacted]w[redacted]d.  Baby being gavage fed and bottle fed.  Encouraged Mom to hold baby STS and Mom declined saying that most of baby is against her skin.  Talked about the benefits of STS with regards to her milk supply.   Mom is pumping every 3 hrs, using Symphony DEBP in baby's room and last pumping she expressed 5 ml.    Asked if she would like help at next feeding, but Mom declined saying baby's respiratory rate went really low at last attempt. SLP is to assess at next feeding.    Lactation to follow-up 1/10 or 1/11 with breastfeeding assist/assessment.     Interventions Interventions: Breast feeding basics reviewed;Skin to skin;Breast massage;Hand express;DEBP  Lactation Tools Discussed/Used Tools: Pump Breast pump type: Double-Electric Breast Pump   Consult Status Consult Status: Follow-up Date: 01/25/20 Follow-up type: In-patient    Monica Ali 01/24/2020, 1:58 PM

## 2020-01-26 ENCOUNTER — Ambulatory Visit: Payer: BC Managed Care – PPO

## 2020-01-27 ENCOUNTER — Ambulatory Visit: Payer: Self-pay

## 2020-01-27 ENCOUNTER — Encounter: Payer: Medicaid Other | Admitting: Advanced Practice Midwife

## 2020-01-27 NOTE — Lactation Note (Signed)
This note was copied from a baby's chart. Lactation Consultation Note  Patient Name: Monica Ali [id number] Date: 01/27/2020 Reason for consult: Follow-up assessment;Difficult latch;Primapara;1st time breastfeeding;Infant < 6lbs;Late-preterm 34-36.6wks Age:21 days  LC in to assist with positioning and latching baby to the breast.  Mom hasn't been consistently pumping and baby is getting 1:1 breast milk and formula or only formula by gavage and bottles.  Baby took 11% po yesterday.    Mom needing education on importance of consistent pumping to support her milk supply.  Provided basins for drying parts away from sink.   Baby positioned with assistance, STS in cross cradle hold, guiding Mom's hands to support and sandwich her breast.  Baby opening and trying to latch, but unable to sustain a deep latch.  Mom hand expressed drops from nipple.  LC initiated a 16 mm nipple shield, showing Mom how to invert first prior to placing on her breast.  Nipple pulled well into shield.  Baby latched and sucked on and off with 2 identified swallows for 10 mins.  Baby came off breast on her own and burped and became sleepy.   Assisted Mom to place baby STS on her chest while having her gavage feeding.   Reminded Mom and baby's RN importance of frequent pumping when baby is fed, or at least 8 times per 24 hrs.    Mom aware of lactation support available and would like assist tomorrow at 3 pm.  Feeding Feeding Type: Breast Fed  LATCH Score Latch: Repeated attempts needed to sustain latch, nipple held in mouth throughout feeding, stimulation needed to elicit sucking reflex.  Audible Swallowing: A few with stimulation (2 swallows identified)  Type of Nipple: Everted at rest and after stimulation  Comfort (Breast/Nipple): Soft / non-tender  Hold (Positioning): Assistance needed to correctly position infant at breast and maintain latch.  LATCH Score: 7  Interventions Interventions: Breast feeding  basics reviewed;Assisted with latch;Skin to skin;Breast massage;Hand express;Breast compression;Adjust position;Support pillows;Position options;Expressed milk;DEBP  Lactation Tools Discussed/Used Tools: Pump;Nipple Shields Nipple shield size: 16 Breast pump type: Double-Electric Breast Pump   Consult Status Consult Status: Follow-up Date: 01/28/20 Follow-up type: In-patient    Judee Clara 01/27/2020, 3:52 PM

## 2020-01-28 ENCOUNTER — Ambulatory Visit: Payer: Self-pay

## 2020-01-28 NOTE — Lactation Note (Signed)
This note was copied from a baby's chart. Lactation Consultation Note  Patient Name: Monica Ali [id number] Date: 01/28/2020 Reason for consult: Follow-up assessment;Late-preterm 34-36.6wks;Primapara;1st time breastfeeding;Infant < 6lbs;NICU baby Age:21 days   P1 mother whose infant is now 74 days old.  This is a LPTI born at 35+5 weeks with a CGA of 36+5 weeks weighing <lbs and in the NICU.  Mother requested a lactation consultation for 1500 today.  Positioned mother appropriately with good pillow support.  Mother is using a #16 NS which is appropriate at this time.  Discussed basic latching techniques and assisted to latch to the right breast in the football hold with ease.  Educated mother on the importance of supporting her breast and waiting for baby to have a wide gape before latching.  Once at the breast and sucking I demonstrated breast compressions.  Intermittent swallows noted.  Baby was calm and paced well at the breast.  When she became sleepy I demonstrated gentle stimulation.  Reinforced nutritive sucking at the breast with a time limit of 30 minutes for her feedings.  Observed baby feeding for 14 minutes before she released.  RN in room and will gavage the remaining feeding volume.  Continue to educate mother during feedings.  Praised mother for her efforts and asked her to post pump for 15 minutes.  Mother stated she has been pumping every three hours.  Mother will call for latch assistance as needed.  She has a DEBP for home use.   Maternal Data    Feeding Feeding Type: Formula  LATCH Score Latch: Grasps breast easily, tongue down, lips flanged, rhythmical sucking.  Audible Swallowing: A few with stimulation  Type of Nipple: Everted at rest and after stimulation  Comfort (Breast/Nipple): Soft / non-tender  Hold (Positioning): Assistance needed to correctly position infant at breast and maintain latch.  LATCH Score: 8  Interventions Interventions: Breast feeding  basics reviewed;Assisted with latch;Breast massage;Breast compression;Adjust position;Position options;Support pillows  Lactation Tools Discussed/Used     Consult Status Consult Status: Follow-up Date: 01/29/20 Follow-up type: In-patient    Dora Sims 01/28/2020, 3:34 PM

## 2020-01-29 ENCOUNTER — Ambulatory Visit: Payer: Self-pay

## 2020-01-29 NOTE — Lactation Note (Signed)
This note was copied from a baby's chart. Lactation Consultation Note  Patient Name: Monica Ali Date: 01/29/2020 Reason for consult: Follow-up assessment Age:21 days  P1 mother whose infant is now 44 days old.  This is a LPTI at 35+5 weeks with a CGA of 36+6 weeks weighing <6 lbs and in the NICU.    RN informed me that mother was breast feeding if I wanted to observe.  Entered mother's room and baby had latched and fed for 5 minutes prior to my arrival.  She was laying in mother's lap frantic and showing feeding cues.  Mother stated she could not get her latched back on the breast.  Offered to assist and mother agreeable.  Calmed baby prior to latching and asked mother to do the same thing if she becomes too restless to latch calmly.  Demonstrated suck training on my gloved finger and baby calmed down easily.  Assisted to latch to the right breast using a NS without difficulty.  Baby began immediately sucking with good jaw extension and wide gape.  Mother happy to see her latching.  Reviewed basic information regarding latching.  RN updated.  Mother will call as needed for assistance.     Maternal Data    Feeding Feeding Type: Breast Fed  LATCH Score Latch: Grasps breast easily, tongue down, lips flanged, rhythmical sucking.  Audible Swallowing: Spontaneous and intermittent  Type of Nipple: Everted at rest and after stimulation  Comfort (Breast/Nipple): Soft / non-tender  Hold (Positioning): Assistance needed to correctly position infant at breast and maintain latch.  LATCH Score: 9  Interventions Interventions: Breast feeding basics reviewed;Assisted with latch;Adjust position;Position options;Support pillows  Lactation Tools Discussed/Used Nipple shield size: 16   Consult Status Consult Status: Follow-up Date: 01/30/20 Follow-up type: In-patient    Monica Ali R Monica Ali 01/29/2020, 6:18 PM

## 2020-02-02 ENCOUNTER — Ambulatory Visit: Payer: BC Managed Care – PPO

## 2020-02-02 ENCOUNTER — Ambulatory Visit: Payer: Medicaid Other

## 2020-02-03 ENCOUNTER — Encounter: Payer: Medicaid Other | Admitting: Advanced Practice Midwife

## 2020-02-10 ENCOUNTER — Encounter: Payer: Medicaid Other | Admitting: Student

## 2020-02-17 ENCOUNTER — Encounter: Payer: Medicaid Other | Admitting: Family Medicine

## 2020-02-25 ENCOUNTER — Encounter: Payer: Self-pay | Admitting: Obstetrics & Gynecology

## 2020-02-25 ENCOUNTER — Telehealth (INDEPENDENT_AMBULATORY_CARE_PROVIDER_SITE_OTHER): Payer: BC Managed Care – PPO | Admitting: Obstetrics & Gynecology

## 2020-02-25 ENCOUNTER — Telehealth: Payer: Self-pay | Admitting: Family Medicine

## 2020-02-25 DIAGNOSIS — Z8751 Personal history of pre-term labor: Secondary | ICD-10-CM | POA: Diagnosis not present

## 2020-02-25 DIAGNOSIS — F53 Postpartum depression: Secondary | ICD-10-CM | POA: Diagnosis not present

## 2020-02-25 DIAGNOSIS — O99345 Other mental disorders complicating the puerperium: Secondary | ICD-10-CM

## 2020-02-25 MED ORDER — NORGESTIMATE-ETH ESTRADIOL 0.25-35 MG-MCG PO TABS: 1.0000 | ORAL_TABLET | Freq: Every day | ORAL | 11 refills | Status: AC

## 2020-02-25 NOTE — Progress Notes (Signed)
Attempted to reach pt via phone for virtual appt x 2 , no answer and VM box full, unable to leave message.  Sent pt mychart message with reminder of today's appt.  Dr Debroah Loop aware.  Sent message to front office for pt to reschedule PP visit.       I connected with@ on 02/25/20 at 10:55 AM EST by: Mychart video and verified that I am speaking with the correct person using two identifiers.  Patient is located at home and provider is located at [facility] [employer] [employer] [facility] .     The purpose of this virtual visit is to provide medical care while limiting exposure to the novel coronavirus. I discussed the limitations, risks, security and privacy concerns of performing an evaluation and management service by RN and the availability of in person appointments. I also discussed with the patient that there may be a patient responsible charge related to this service. By engaging in this virtual visit, you consent to the provision of healthcare.  Additionally, you authorize for your insurance to be billed for the services provided during this visit.  The patient expressed understanding and agreed to proceed.  The following staff members participated in the virtual visit:  Monica Ali  Post Partum Visit Note Subjective:   Monica Ali is a 20 y.o. G70P0101 female being evaluated for postpartum followup.  She is 5 weeks postpartum following a normal spontaneous vaginal delivery at  35.5 gestational weeks.  I have fully reviewed the prenatal and intrapartum course; pregnancy complicated by precipitous delivery.  Postpartum course has been normal. Baby is doing well. Baby is feeding by bottle - Similac Neosure. Bleeding no bleeding. Bowel function is normal. Bladder function is normal. Patient is sexually active. Contraception method is OCP (estrogen/progesterone). Postpartum depression screening: positive.   The pregnancy intention screening data noted above was reviewed. Potential  methods of contraception were discussed. The patient elected to proceed with Oral Contraceptive.   The following portions of the patient's history were reviewed and updated as appropriate: allergies, current medications, past family history, past medical history, past social history, past surgical history and problem list.  Review of Systems Pertinent items are noted in HPI.   Objective:  There were no vitals filed for this visit. Self-Obtained       Assessment:    normal postpartum exam.  Plan:  Essential components of care per ACOG recommendations:  1.  Mood and well being: Patient with negative depression screening today. Reviewed local resources for support.  - Patient does not use tobacco.  - hx of drug use? No   2. Infant care and feeding:  -Patient currently breastmilk feeding? No  -Social determinants of health (SDOH) reviewed in EPIC. No concerns  3. Sexuality, contraception and birth spacing - Patient does not want a pregnancy in the next year.  Desired family size is 2 children.  - Reviewed forms of contraception in tiered fashion. Patient desired oral contraceptives (estrogen/progesterone) today.   - Discussed birth spacing of 18 months  4. Sleep and fatigue -Encouraged family/partner/community support of 4 hrs of uninterrupted sleep to help with mood and fatigue  5. Physical Recovery  - Discussed patients delivery- Patient has urinary incontinence? No  - Patient is safe to resume physical and sexual activity  6.  Health Maintenance - Last pap smear age 82   15 minutes of non-face-to-face time spent with the patient    Monica Jarvis, RN [facility]

## 2020-02-25 NOTE — Telephone Encounter (Signed)
I called pt to resch but no answer.

## 2020-03-01 NOTE — BH Specialist Note (Deleted)
Integrated Behavioral Health via Telemedicine Visit  03/01/2020 Monica Ali [id number]  Number of Integrated Behavioral Health visits: 1 Session Start time: 1:15***  Session End time: 2:15*** Total time: {IBH Total Time:21014050}  Referring Provider: *** Patient/Family location: Home*** [facility]: [facility]  All persons participating in visit: [facility] [employer] [facility] [employer] [facility]  Types of Service: {CHL AMB TYPE OF SERVICE:[phone]}  I connected with Monica Ali and/or Mylissa Martinek's {family members:20773} by Telephone  (Video is Surveyor, mining) and verified that I am speaking with the correct person using two identifiers.Discussed confidentiality: {YES/NO:21197}  I discussed the limitations of telemedicine and the availability of in person appointments.  Discussed there is a possibility of technology failure and discussed alternative modes of communication if that failure occurs.  I discussed that engaging in this telemedicine visit, they consent to the provision of behavioral healthcare and the services will be billed under their insurance.  Patient and/or legal guardian expressed understanding and consented to Telemedicine visit: {YES/NO:21197}  Presenting Concerns: Patient and/or family reports the following symptoms/concerns: *** Duration of problem: ***; Severity of problem: {Mild/Moderate/Severe:20260}  Patient and/or Family's Strengths/Protective Factors: {CHL AMB BH PROTECTIVE FACTORS:[phone]}  Goals Addressed: Patient will: 1.  Reduce symptoms of: {IBH Symptoms:21014056}  2.  Increase knowledge and/or ability of: {IBH Patient Tools:21014057}  3.  Demonstrate ability to: {IBH Goals:21014053}  Progress towards Goals: {CHL AMB BH PROGRESS TOWARDS GOALS:[phone]}  Interventions: Interventions utilized:  {IBH Interventions:21014054} Standardized  Assessments completed: {IBH Screening Tools:21014051}  Patient and/or Family Response: ***  Assessment: Patient currently experiencing ***.   Patient may benefit from ***.  Plan: 1. Follow up with behavioral health clinician on : *** 2. Behavioral recommendations: *** 3. Referral(s): {IBH Referrals:21014055}  I discussed the assessment and treatment plan with the patient and/or parent/guardian. They were provided an opportunity to ask questions and all were answered. They agreed with the plan and demonstrated an understanding of the instructions.   They were advised to call back or seek an in-person evaluation if the symptoms worsen or if the condition fails to improve as anticipated.  Rae Lips, LCSW   Depression screen [facility] 2/9 12/29/2019 12/01/2019 10/16/2019 09/23/2019 08/19/2019  Decreased Interest 0 0 0 0 1  Down, Depressed, Hopeless 0 0 0 0 0  PHQ - 2 Score 0 0 0 0 1  Altered sleeping 3 2 0 2 2  Tired, decreased energy 2 2 1 2 1   Change in appetite 1 0 0 1 1  Feeling bad or failure about yourself  0 0 0 0 0  Trouble concentrating 0 0 0 0 0  Moving slowly or fidgety/restless 1 0 0 0 0  Suicidal thoughts 0 0 0 0 0  PHQ-9 Score 7 4 1 5 5    GAD 7 : Generalized Anxiety Score 12/29/2019 12/01/2019 10/16/2019 09/23/2019  Nervous, Anxious, on Edge 0 0 0 0  Control/stop worrying 1 0 0 0  Worry too much - different things 1 1 0 0  Trouble relaxing 1 1 0 0  Restless 0 0 0 0  Easily annoyed or irritable 2 1 0 1  Afraid - awful might happen 0 0 0 0  Total GAD 7 Score 5 3 0 1

## 2020-03-02 ENCOUNTER — Telehealth: Payer: Self-pay | Admitting: Clinical

## 2020-03-02 NOTE — Telephone Encounter (Signed)
Attempted to call pt to reschedule appt. Pt did not answer and voicemail box was full. MyChart message will be sent alerting patient of need ot reschedule. // K. Braxton

## 2020-03-04 ENCOUNTER — Ambulatory Visit: Payer: BC Managed Care – PPO | Admitting: Medical

## 2020-03-07 NOTE — BH Specialist Note (Signed)
Integrated Behavioral Health via Telemedicine Visit  03/07/2020 Monica Ali [id number]  Number of Integrated Behavioral Health visits: 1 Session Start time: 3:25  Session End time: 4:26 Total time: 68  Referring Provider: Scheryl Darter, MD Monica/Family location: Home [facility] Provider location: [facility]  All persons participating in visit: Monica Ali and [facility] Tehya Leath   Types of Service: Individual psychotherapy  I connected with Rolene Course and/or Yumiko Heinecke's n/a by Telephone  (Video is Surveyor, mining) and verified that I am speaking with the correct person using two identifiers.Discussed confidentiality: Yes   I discussed the limitations of telemedicine and the availability of in person appointments.  Discussed there is a possibility of technology failure and discussed alternative modes of communication if that failure occurs.  I discussed that engaging in this telemedicine visit, they consent to the provision of behavioral healthcare and the services will be billed under their insurance.  Monica and/or legal guardian expressed understanding and consented to Telemedicine visit: Yes   Presenting Concerns: Monica and/or family reports the following symptoms/concerns: Pt states her primary concern today is mood instability, panic, irritability, quick to anger, depression, worry, fatigue, increase attributed to adjusting to new motherhood and conflict with FOB. Depression and self-harm began in middle school; SI in high school. Pt's goal is to manage her emotions in a healthy way, to establish a healthier environment for her daughter, than she had growing up.  Duration of problem: Increase over time; began at least 8 years ago; Severity of problem: severe  Monica and/or Family's Strengths/Protective Factors: Social connections, Concrete supports in place (healthy food, safe environments, etc.) and  Sense of purpose  Goals Addressed: Monica will: 1.  Reduce symptoms of: agitation, anxiety, depression, mood instability and stress  2.  Increase knowledge and/or ability of: healthy habits and self-management skills  3.  Demonstrate ability to: Increase healthy adjustment to current life circumstances, Increase adequate support systems for Monica/family and Increase motivation to adhere to plan of care  Progress towards Goals: Ongoing  Interventions: Interventions utilized:  Mindfulness or Management consultant and Psychoeducation and/or Health Education Standardized Assessments completed: GAD-7 and PHQ 9  Monica and/or Family Response: Pt agrees to treatment plan  Assessment: Monica currently experiencing Mood disorder, unspecified.   Monica may benefit from psychoeducation and brief therapeutic interventions regarding coping with symptoms of anxiety with panic, depression, current life stress .  Plan: 1. Follow up with behavioral health clinician on : Three weeks; Call Asher Muir at [phone] as needed prior to scheduled visit 2. Behavioral recommendations:  -Accept referral to psychiatry (Make sure voicemail is not full to receive call to schedule for initial appointment) -CALM relaxation breathing exercise daily (morning; as needed throughout the day) -Continue sleeping when baby sleeps as able; consider eating at least every 5 hours during daytime (to prevent escalation of irritability from going a long time without eating) -Read educational materials regarding coping with symptoms of anxiety with panic -Consider new mom online support group(s) at either [email or web address] or www.[email or web address].net 3. Referral(s): Integrated Art gallery manager (In Clinic) and [facility] (LME/Outside Clinic)  I discussed the assessment and treatment plan with the Monica and/or parent/guardian. They were provided an opportunity to ask questions and all were  answered. They agreed with the plan and demonstrated an understanding of the instructions.   They were advised to call back or seek an in-person evaluation if the symptoms worsen or if the condition fails to improve  as anticipated.  Rae Lips, LCSW   Depression screen [facility] 2/9 03/08/2020 12/29/2019 12/01/2019 10/16/2019 09/23/2019  Decreased Interest 3 0 0 0 0  Down, Depressed, Hopeless 2 0 0 0 0  PHQ - 2 Score 5 0 0 0 0  Altered sleeping 3 3 2  0 2  Tired, decreased energy 3 2 2 1 2   Change in appetite 2 1 0 0 1  Feeling bad or failure about yourself  3 0 0 0 0  Trouble concentrating 3 0 0 0 0  Moving slowly or fidgety/restless 2 1 0 0 0  Suicidal thoughts 0 0 0 0 0  PHQ-9 Score 21 7 4 1 5    GAD 7 : Generalized Anxiety Score 03/08/2020 12/29/2019 12/01/2019 10/16/2019  Nervous, Anxious, on Edge 1 0 0 0  Control/stop worrying 3 1 0 0  Worry too much - different things 3 1 1  0  Trouble relaxing 3 1 1  0  Restless 1 0 0 0  Easily annoyed or irritable 3 2 1  0  Afraid - awful might happen 3 0 0 0  Total GAD 7 Score 17 5 3  0

## 2020-03-08 ENCOUNTER — Ambulatory Visit (INDEPENDENT_AMBULATORY_CARE_PROVIDER_SITE_OTHER): Payer: BC Managed Care – PPO | Admitting: Clinical

## 2020-03-08 DIAGNOSIS — F39 Unspecified mood [affective] disorder: Secondary | ICD-10-CM

## 2020-03-08 DIAGNOSIS — O99345 Other mental disorders complicating the puerperium: Secondary | ICD-10-CM

## 2020-03-08 DIAGNOSIS — F53 Postpartum depression: Secondary | ICD-10-CM

## 2020-03-08 NOTE — Patient Instructions (Signed)
[facility] °[address] °[place], [place] [address] °[phone] (main office) °[phone] (Torin Modica's office) ° °Behavioral Health Resources:  ° °What if I or someone I know is in crisis? ° °If you are thinking about harming yourself or having thoughts of suicide, or if you know someone who is, seek help right away. ° °Call your doctor or mental health care provider. ° °Call 911 or go to a hospital emergency room to get immediate help, or ask a friend or family member to help you do these things; IF YOU ARE IN [place], YOU MAY GO TO WALK-IN URGENT CARE 24/7 at [facility] (see below) ° °Call the [place] National Suicide Prevention Lifeline’s toll-free, 24-hour hotline at 1-800-273-TALK (1-800-273-8255) or TTY: 1-800-799-4 TTY (1-800-799-4889) to talk to a trained counselor. ° °If you are in crisis, make sure you are not left alone.  ° °If someone else is in crisis, make sure he or she is not left alone ° ° °24 Hour :  ° °[facility]  °[address], [place], [place] [address] [phone] or [phone] °WALK-IN URGENT CARE 24/7 ° °[facility] Mobile Crisis: 1-877-626-1772 ° °[place] National Suicide Hotline: 1-800-273-8255 ° °[facility] Crisis [facility] °(Domestic Violence, Rape & Victim Assistance)  336-273-7273 ° °[facility]  °[address] [place], [place]  [address]   1-855-788-8787 or 336-676-6840  ° °[facility]: [phone] (8am-4pm) or 1-866- 261-5769 (after hours)      ° ° °[facility] °24/7 Walk-in Clinic, [address], [place], [place]  [phone] °Fax: [phone] [email or web address] °*Interpreters available °*Accepts all insurance and uninsured for Urgent Care needs °*Accepts Medicaid and uninsured for outpatient treatment  ° °[facility]   °Mon-Fri: 8am-5pm °[address], [place], [place] [phone](phone); [phone](fax) [email or web address]  °*Accepts Medicare ° °[facility] °Mon, Tues, Thurs, Fri: 8am-4pm °[address], [place], [place] [address] °[phone] (phone); [phone] (fax) °[facility]  °*Accepts Medicare ° °[facility] °Mon-Fri: 9am-5pm  °[address], [place], [place] °[phone] (phone); 336-540-9454  °[email or web address]  °*Accepts Medicaid ° °[facility] °[address], [place], [place]  °[phone] °[email or web address]://[email or web address]  ° °[facility] °Mon-Fri, 8:30am-12pm/1pm-2:30pm °[address], [place], [place] [phone] (phone); [phone] (fax) °www.[facility].org  °*Accepts Medicaid, sliding-scale*Bilingual services available ° °[facility] °Mon-Fri, 8am-7pm °[address], [place], [place]  °[phone](phone); [phone](fax) °www.[facility].org  °*Accepts Medicaid *Bilingual services available ° °[facility] °Mon-Fri: 8am-5pm, Saturday by appointment only °[address], [place], [place] °[phone] (phone); [phone] (fax) °www.[email or web address]  ° °[facility] °[address], [place], [place] °[phone] °www.[email or web address]  °*Free & reduced services for uninsured and underinsured individuals °*Bilingual services for Spanish-speaking clients 21 and under ° °[facility] °24/7 Walk-in Clinic, [address], [place], [place] °[phone](phone); 336-676-6409(fax) °www.[facility].org  °*Bring your own interpreter at first visit °*Accepts Medicare and Medicaid ° °[facility] °Mon-Fri: 9am-5:30pm °[address], [place], [place] °[phone] (phone), [phone] (fax) °After hours crisis line:  336-763-1165 °[facility]  °*Accepts Medicare and Medicaid ° °[facility] °Mon-Thurs, 8am-6pm °[address], [place], [place]  °[phone] (phone); 336-288-0738 (fax) °[facility]://[email or web address]  °*Subsidized costs available ° °[facility] °Mon-Fri: 8am-4pm °[address], ,  °[phone](phone); 336-834-9698(fax) °www.[facility].com  °*Accepts Medicaid ° °[facility] °Same day access hours: Mon-Fri, 8:30-3pm °Crisis hours: Mon-Fri,   hours: Mon-Fri, 8:30-3pm Crisis hours: Mon-Fri, 8am-5pm [address], [place], [place] [phone]  [facility] Same day access hours: Mon-Fri, 8:30-3pm Crisis hours: Mon-Fri, 8am-8pm [address], [place], [place] [phone] (phone); [phone] (fax) www.[facility]org  *Accepts Medicaid and Medicare  [facility] [place], [place], Fri: 9am-9pm Tues, Thurs: 9am-6pm [address] [place], [place], [place]  [phone] (phone); [phone] (fax) https://[email or web address]  *(Accepts Medicare and Medicaid; payment plans available)*Bilingual services available  [facility] [address] [place], [place] [phone] (phone); [phone] (fax) www.[email or web address]   [facility] [address], [place], [place]  [phone]  [email or web address]  *Bilingual services available  [facility] Mon-Thurs: 8am-8pm [address], [place], [place] [phone] (phone); [phone] (fax) ScrapbookLive.[email or web address]  *Accepts Medicaid*Bilingual services available  [facility] [address] [place], [place] [phone] (phone) [email or web address]  *Accepts Medicaid *Bilingual services available  [facility] Mon-Fri, 9am-4:45pm [address], [place], [place] [phone] (phone); [phone]  (fax) [email or web address]://[email or web address]  *Accepts Medicare  [facility] Mon-Thurs: 8:30-8pm, Fri: 8:30am-7pm [address] [place], [place] (3rd floor) [phone] (phone); [phone] (fax) [email or web address].[email or web address]  *Accepts Medicaid; income-based reduced rates available  [facility] Mon-Fri: 8am-5pm [address], [place], [place] [address] [phone] (phone); [phone] (fax) [email or web address]://[email or web address]  *Accepts Medicaid*Bilingual services available   [facility] [place])  [address], [place] [phone] www.[email or web address]  *Provides direct services to individuals in recovery from mental illness, including support groups, recovery skills classes, and one on one peer support  [employer] on Mental Illness) [place] helpline: (409)464-5125  [facility] [place] helpline: 272-116-0620 https://namiguilford.org  *A community hub for information relating to local resources and services for the friends and families of individuals living alongside a mental health condition, as well as the individuals themselves. Classes and support groups also provided      Coping with Panic Attacks   What is a panic attack?  You may have had a panic attack if you experienced four or more of the symptoms listed below coming on abruptly and peaking in about 10 minutes.  Panic Symptoms   . Pounding heart  . Sweating  . Trembling or shaking  . Shortness of breath  . Feeling of choking  . Chest pain  . Nausea or abdominal distress    . Feeling dizzy, unsteady, lightheaded, or faint  . Feelings of unreality or being detached from yourself  . Fear of losing control or going crazy  . Fear of dying  . Numbness or tingling  . Chills or hot flashes      Panic attacks are sometimes accompanied by avoidance of certain places or situations. These are often situations that would be difficult to escape from or in  which help might not be available. Examples might include crowded shopping malls, public transportation, restaurants, or driving.   Why do panic attacks occur?   Panic attacks are the body's alarm system gone awry. All of [place] have a built-in alarm system, powered by adrenaline, which increases our heart rate, breathing, and blood flow in response to danger. Ordinarily, this 'danger response system' works well. In some people, however, the response is either out of proportion to whatever stress is going on, or may come out of the blue without any stress at all.   For example, if you are walking in the woods and see a bear coming your way, a variety of changes occur in your body to prepare you  to either fight the danger or flee from the situation. Your heart rate will increase to get more blood flow around your body, your breathing rate will quicken so that more oxygen is available, and your muscles will tighten in order to be ready to fight or run. You may feel nauseated as blood flow leaves your stomach area and moves into your limbs. These bodily changes are all essential to helping you survive the dangerous situation. After the danger has passed, your body functions will begin to go back to normal. This is because your body also has a system for "recovering" by bringing your body back down to a normal state when the danger is over.   As you can see, the emergency response system is adaptive when there is, in fact, a "true" or "real" danger (e.g., bear). However, sometimes people find that their emergency response system is triggered in "everyday" situations where there really is no true physical danger (e.g., in a meeting, in the grocery store, while driving in normal traffic, etc.).   What triggers a panic attack?  Sometimes particularly stressful situations can trigger a panic attack. For example, an argument with your spouse or stressors at work can cause a stress response (activating the emergency  response system) because you perceive it as threatening or overwhelming, even if there is no direct risk to your survival.  Sometimes panic attacks don't seem to be triggered by anything in particular- they may "come out of the blue". Somehow, the natural "fight or flight" emergency response system has gotten activated when there is no real danger. Why does the body go into "emergency mode" when there is no real danger?   Often, people with panic attacks are frightened or alarmed by the physical sensations of the emergency response system. First, unexpected physical sensations are experienced (tightness in your chest or some shortness of breath). This then leads to feeling fearful or alarmed by these symptoms ("Something's wrong!", "Am I having a heart attack?", "Am I going to faint?") The mind perceives that there is a danger even though no real danger exists. This, in turn, activates the emergency response system ("fight or flight"), leading to a "full blown" panic attack. In summary, panic attacks occur when we misinterpret physical symptoms as signs of impending death, craziness, loss of control, embarrassment, or fear of fear. Sometimes you may be aware of thoughts of danger that activate the emergency response system (for example, thinking "I'm having a heart attack" when you feel chest pressure or increased heart rate). At other times, however, you may not be aware of such thoughts. After several incidences of being afraid of physical sensations, anxiety and panic can occur in response to the initial sensations without conscious thoughts of danger. Instead, you just feel afraid or alarmed. In other words, the panic or fear may seem to occur "automatically" without you consciously telling yourself anything.   After having had one or more panic attacks, you may also become more focused on what is going on inside your body. You may scan your body and be more vigilant about noticing any symptoms that might  signal the start of a panic attack. This makes it easier for panic attacks to happen again because you pick up on sensations you might otherwise not have noticed, and misinterpret them as something dangerous. A panic attack may then result.      How do I cope with panic attacks?  An important part of overcoming panic attacks involves re-interpreting your  body's physical reactions and teaching yourself ways to decrease the physical arousal. This can be done through practicing the cognitive and behavioral interventions below.   Research has found that over half of people who have panic attacks show some signs of hyperventilation or overbreathing. This can produce initial sensations that alarm you and lead to a panic attack. Overbreathing can also develop as part of the panic attack and make the symptoms worse. When people hyperventilate, certain blood vessels in the body become narrower. In particular, the brain may get slightly less oxygen. This can lead to the symptoms of dizziness, confusion, and lightheadedness that often occur during panic attacks. Other parts of the body may also get a bit less oxygen, which may lead to numbness or tingling in the hands or feet or the sensation of cold, clammy hands. It also may lead the heart to pump harder. Although these symptoms may be frightening and feel unpleasant, it is important to remember that hyperventilating is not dangerous. However, you can help overcome the unpleasantness of overbreathing by practicing Breathing Retraining.   Practice this basic technique three times a day, every day:  . Inhale. With your shoulders relaxed, inhale as slowly and deeply as you can while you count to six. If you can, use your diaphragm to fill your lungs with air.  . Hold. Keep the air in your lungs as you slowly count to four.  . Exhale. Slowly breath out as you count to six.  . Repeat. Do the inhale-hold-exhale cycle several times. Each time you do it, exhale for  longer counts.  Like any new skill, Breathing Retraining requires practice. Try practicing this skill twice a day for several minutes. Initially, do not try this technique in specific situations or when you become frightened or have a panic attack. Begin by practicing in a quiet environment to build up your skill level so that you can later use it in time of "emergency."   2. Decreasing Avoidance  Regardless of whether you can identify why you began having panic attacks or whether they seemed to come out of the blue, the places where you began having panic attacks often can become triggers themselves. It is not uncommon for individuals to begin to avoid the places where they have had panic attacks. Over time, the individual may begin to avoid more and more places, thereby decreasing their activities and often negatively impacting their quality of life. To break the cycle of avoidance, it is important to first identify the places or situations that are being avoided, and then to do some "relearning."  To begin this intervention, first create a list of locations or situations that you tend to avoid. Then choose an avoided location or situation that you would like to target first. Now develop an "exposure hierarchy" for this situation or location. An "exposure hierarchy" is a list of actions that make you feel anxious in this situation. Order these actions from least to most anxiety-producing. It is often helpful to have the first item on your hierarchy involve thinking or imagining part of the feared/avoided situation.   Here is an example of an exposure hierarchy for decreasing avoidance of the grocery store. Note how it is ordered from the least amount of anxiety (at the top) to the most anxiety (at the bottom):  Marland Kitchen Think about going to the grocery store alone.  . Go to the grocery store with a friend or family member.  . Go to the grocery store alone to  pick up a few small items (5-10 minutes in the  store).  . Shopping for 10-20 minutes in the store alone.  . Doing the shopping for the week by myself (20-30 minutes in the store).   Your homework is to "expose" yourself to the lowest item on your hierarchy and use your breathing relaxation and coping statements (see below) to help you remain in the situation. Practice this several times during the upcoming week. Once you have mastered each item with minimal anxiety, move on to the next higher action on your list.   Cognitive Interventions  1. Identify your negative self-talk Anxious thoughts can increase anxiety symptoms and panic. The first step in changing anxious thinking is to identify your own negative, alarming self-talk. Some common alarming thoughts:  . I'm having a heart attack.            . I must be going crazy. . I think I'm dying. Marland Kitchen People will think I'm crazy. . I'm going to pass our.  . Oh no- here it comes.  . I can't stand this.  Monica Ali got to get out of here!  2. Use positive coping statements Changing or disrupting a pattern of anxious thoughts by replacing them with more calming or supportive statements can help to divert a panic attack. Some common helpful coping statements:  . This is not an emergency.  . I don't like feeling this way, but I can accept it.  . I can feel like this and still be okay.  . This has happened before, and I was okay. I'll be okay this time, too.  . I can be anxious and still deal with this situation.   /Emotional Wellbeing Apps and Websites Here are a few free apps meant to help you to help yourself.  To find, try searching on the internet to see if the app is offered on Apple/Android devices. If your first choice doesn't come up on your device, the good news is that there are many choices! Play around with different apps to see which ones are helpful to you.    Calm This is an app meant to help increase calm feelings. Includes info, strategies, and tools for tracking your  feelings.      Calm Harm  This app is meant to help with self-harm. Provides many 5-minute or 15-min coping strategies for doing instead of hurting yourself.       Healthy Minds Health Minds is a problem-solving tool to help deal with emotions and cope with stress you encounter wherever you are.      MindShift This app can help people cope with anxiety. Rather than trying to avoid anxiety, you can make an important shift and face it.      MY3  MY3 features a support system, safety plan and resources with the goal of offering a tool to use in a time of need.       My Life My Voice  This mood journal offers a simple solution for tracking your thoughts, feelings and moods. Animated emoticons can help identify your mood.       Relax Melodies Designed to help with sleep, on this app you can mix sounds and meditations for relaxation.      Smiling Mind Smiling Mind is meditation made easy: it's a simple tool that helps put a smile on your mind.        Stop, Breathe & Think  A friendly, simple guide for people through  meditations for mindfulness and compassion.  Stop, Breathe and Think Kids Enter your current feelings and choose a "mission" to help you cope. Offers videos for certain moods instead of just sound recordings.       Team Orange The goal of this tool is to help teens change how they think, act, and react. This app helps you focus on your own good feelings and experiences.      The [employer] Station[employer] Box The United Station[employer] Box (VHB) contains simple tools to help patients with coping, relaxation, distraction, and positive thinking.

## 2020-03-21 NOTE — BH Specialist Note (Signed)
Integrated Behavioral Health via Telemedicine Visit  03/21/2020 Monica Ali [id number]  Number of Integrated Behavioral Health visits: 2 Session Start time: 10:54  Session End time: 11:25 Total time: 31  Referring Provider: Scheryl Darter, MD Patient/Family location: Home [facility] Provider location: [facility]  All persons participating in visit: [name] and Monica Ali Monica Ali   Types of Service: Individual psychotherapy and Video visit  I connected with Monica Ali and/or Monica Ali's n/a via  Telephone or Engineer, civil (consulting)  (Video is Surveyor, mining) and verified that I am speaking with the correct person using two identifiers. Discussed confidentiality: Yes   I discussed the limitations of telemedicine and the availability of in person appointments.  Discussed there is a possibility of technology failure and discussed alternative modes of communication if that failure occurs.  I discussed that engaging in this telemedicine visit, they consent to the provision of behavioral healthcare and the services will be billed under their insurance.  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 Pt is now attending ob/gyn with [facility], to have appointments closer to home, and feels she is coping well with all the life changes.  Duration of problem: Ongoing; Severity of problem: moderately severe  Patient and/or Family's Strengths/Protective Factors: Social connections, Concrete supports in place  (healthy food, safe environments, etc.), Sense of purpose and Physical Health (exercise, healthy diet, medication compliance, etc.)  Goals Addressed: Patient will: 1.  Reduce symptoms of: agitation, anxiety, depression, mood instability and stress  2.  Demonstrate ability to: Increase healthy adjustment to current life circumstances  Progress towards Goals: Ongoing  Interventions: Interventions utilized:  Supportive Counseling Standardized Assessments completed: Not Needed  Patient and/or Family Response: Pt agrees to treatment plan  Assessment: Patient currently experiencing Mood disorder, unspecified.   Patient may benefit from continued psychoeducation and brief therapeutic interventions regarding coping with symptoms of mood disorder, along with referral to psychiatry via her [facility] practice .  Plan: 1. Follow up with behavioral health clinician on : Call Monica Ali at [phone] as needed 2. Behavioral recommendations:  -Continue taking prenatal vitamin daily, as recommended by medical providers -At next ob/gyn appointment, ask for psychiatry referral in [place] area  -Continue using self-coping strategies that have been helpful in managing emotions(prioritizing healthy sleeping and eating, relaxation breathing when feeling overwhelmed) -Continue to consider registering for Monica mom online support group at [email or web address] 3. Referral(s): Integrated [employer] (In Clinic)  I discussed the assessment and treatment plan with the patient and/or parent/guardian. They were provided an opportunity to ask questions and all were answered. They agreed with the plan and demonstrated an understanding of the instructions.   They were advised to call back or seek an in-person evaluation if the symptoms worsen or if the condition fails to improve as anticipated.  Valetta Close Chace Bisch, LCSW

## 2020-03-30 ENCOUNTER — Ambulatory Visit (INDEPENDENT_AMBULATORY_CARE_PROVIDER_SITE_OTHER): Payer: BC Managed Care – PPO | Admitting: Clinical

## 2020-03-30 DIAGNOSIS — F39 Unspecified mood [affective] disorder: Secondary | ICD-10-CM

## 2020-03-30 NOTE — Patient Instructions (Signed)
[facility] [address] [place], [place] [address] [phone] (main office) [phone] (Taggert Bozzi's office)   

## 2021-04-20 IMAGING — US US MFM UA CORD DOPPLER
1 series · 14 of 28 positions shown · non-contrast
Comparison: none

[Series 1: us mfm ua cord doppler · 46 acquisitions, 14 frames shown]
[im 2/46]
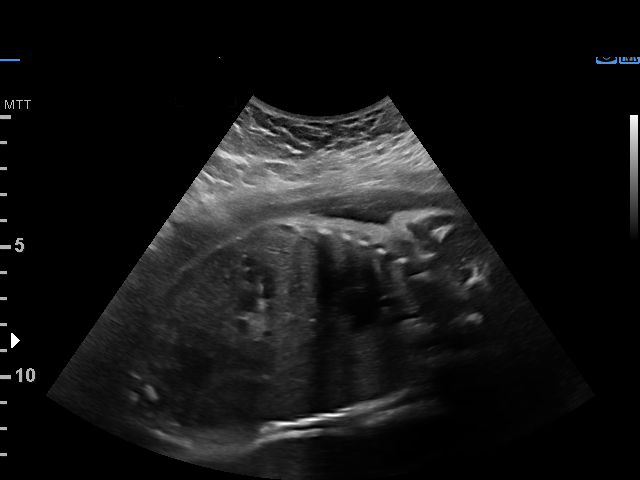
[im 6/46]
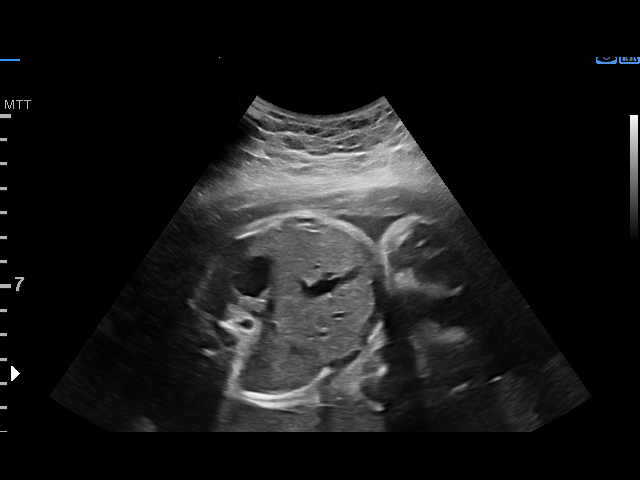
[im 9/46]
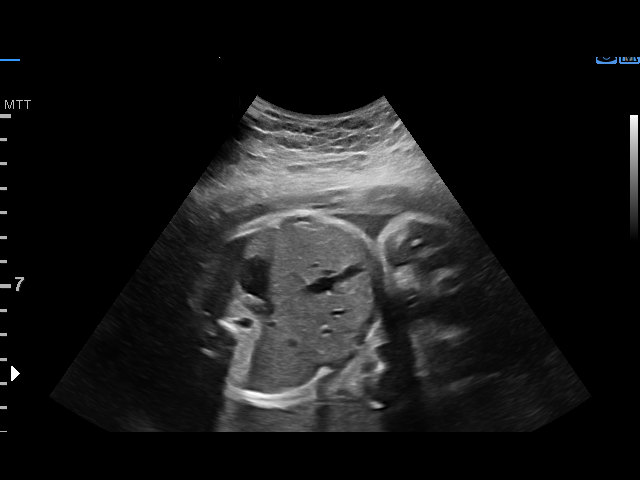
[im 12/46]
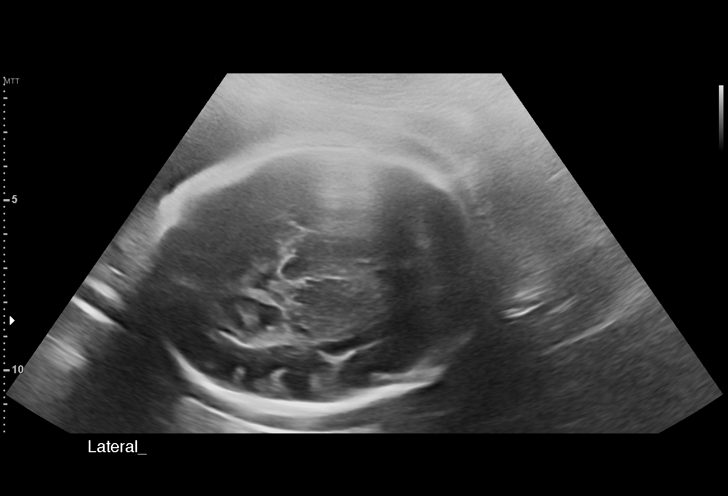
[im 16/46]
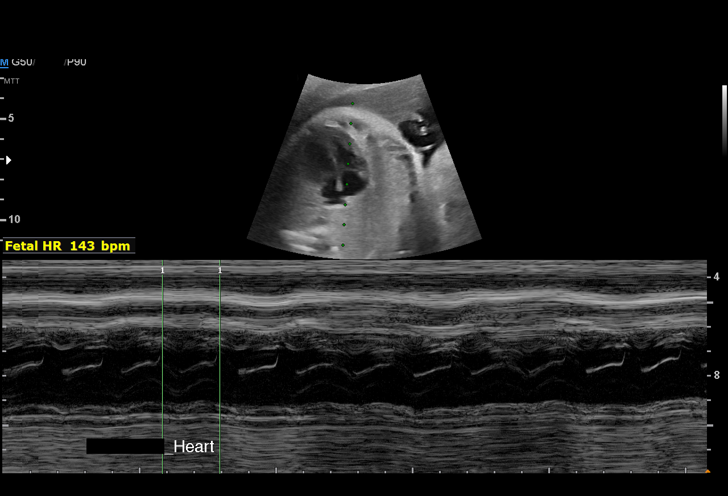
[im 19/46]
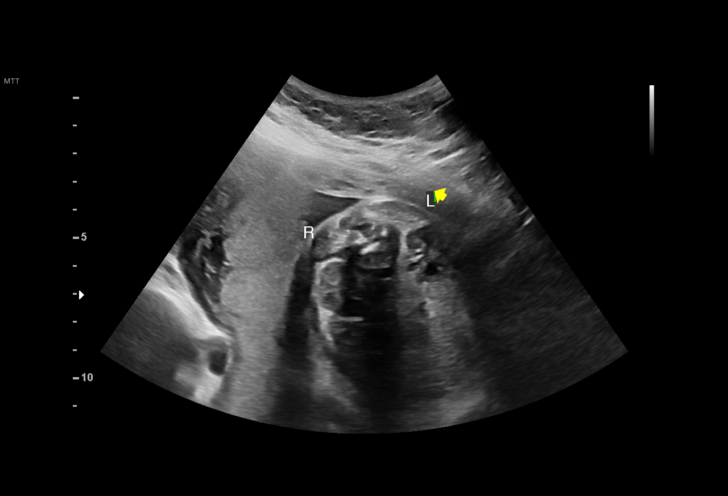
[im 22/46]
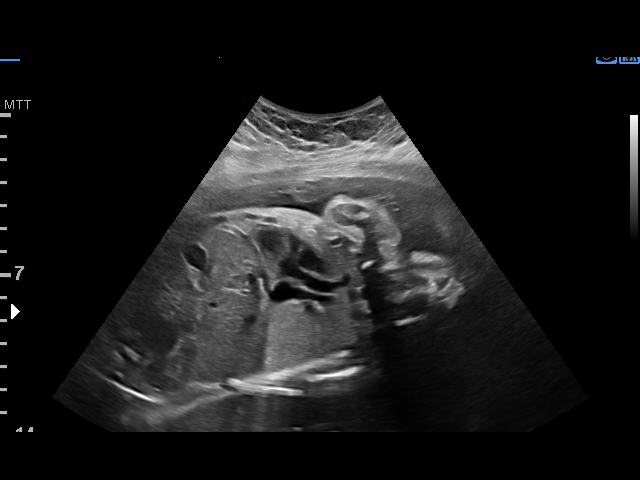
[im 26/46]
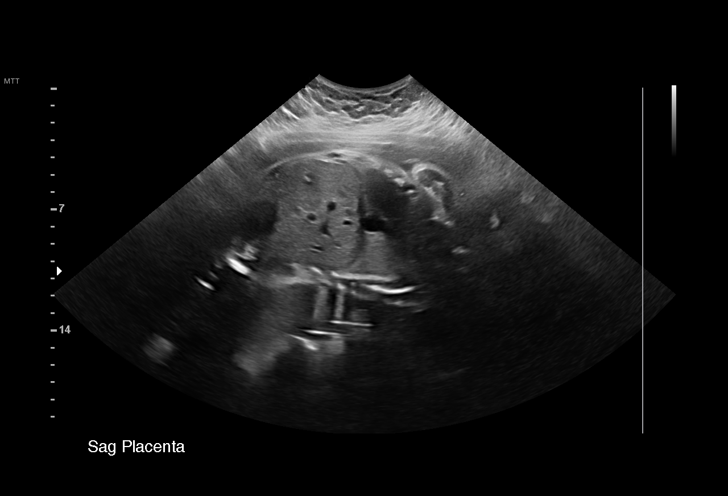
[im 29/46]
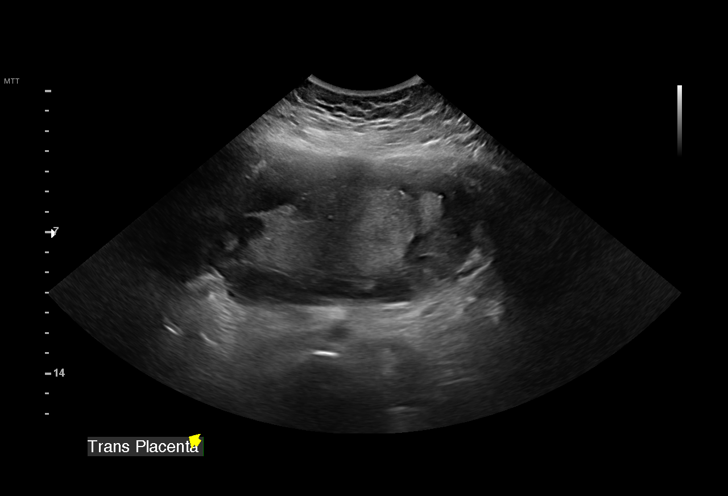
[im 32/46]
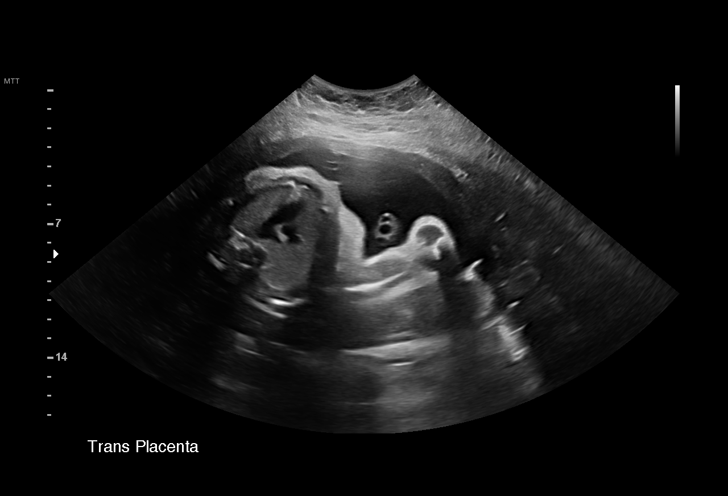
[im 36/46]
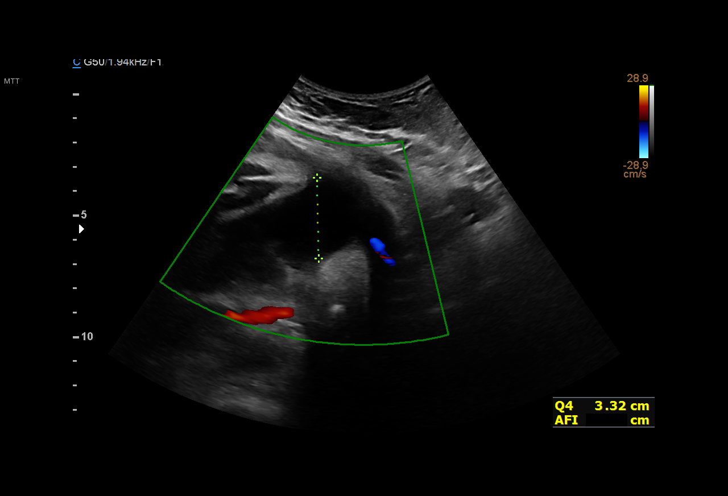
[im 39/46]
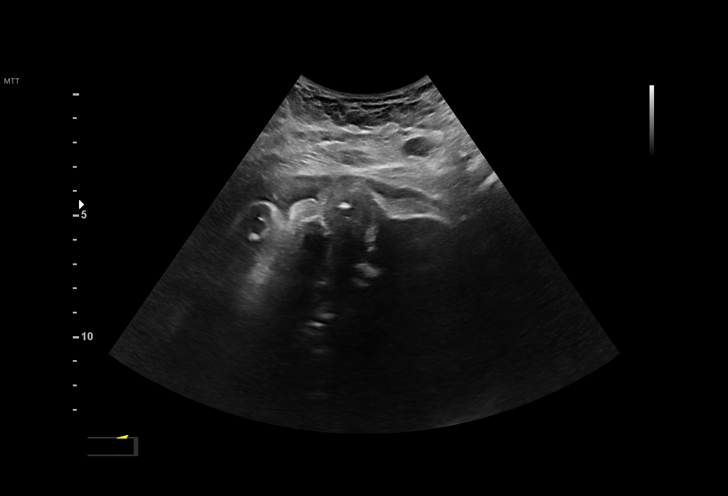
[im 42/46]
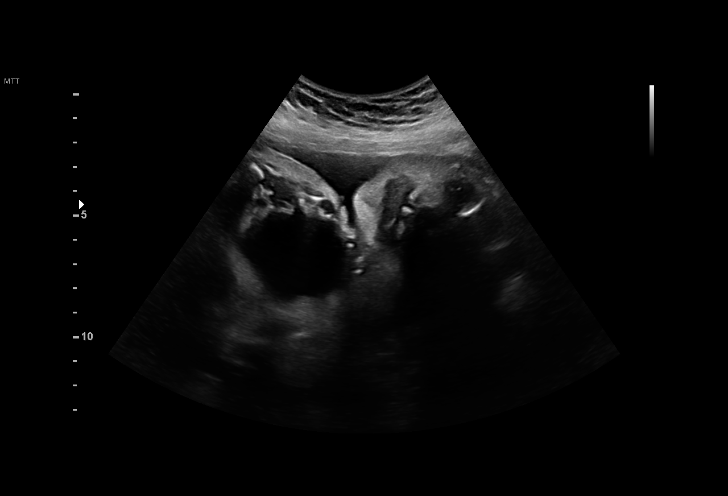
[im 46/46]
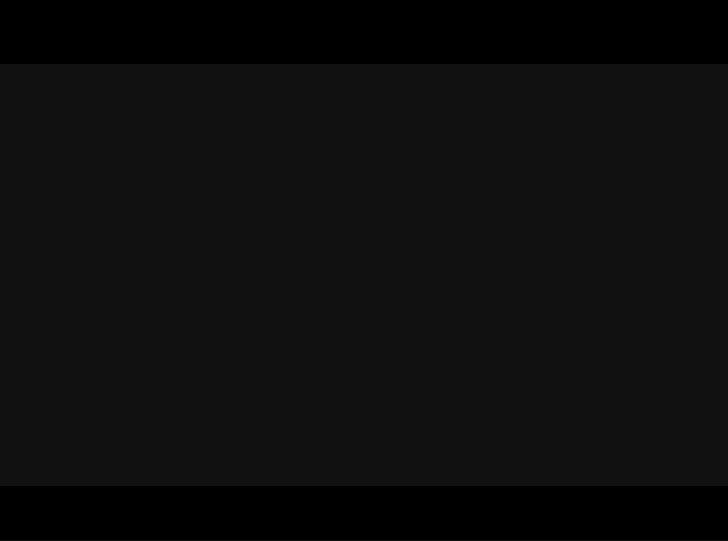

[14 of 28 positions shown; findings below may reference images not displayed]

ADLAHO NP

 2  US MFM UA CORD DOPPLER                76820.02    AUAD
                                                      CEOL[facility]

Indications

 32 weeks gestation of pregnancy
 Maternal care for known or suspected poor
 fetal growth, second trimester, not applicable
 or unspecified IUGR
 Teen pregnancy
 Low Risk NIPS
 Encounter for other antenatal screening
 follow-up
Fetal Evaluation

 Num Of Fetuses:         1
 Fetal Heart Rate(bpm):  143
 Cardiac Activity:       Observed
 Presentation:           Cephalic
 Placenta:               Posterior
 P. Cord Insertion:      Previously Visualized

 Amniotic Fluid
 AFI FV:      Within normal limits

 AFI Sum(cm)     %Tile       Largest Pocket(cm)
 13.5            44

 RUQ(cm)       RLQ(cm)       LUQ(cm)        LLQ(cm)
 4
Biophysical Evaluation

 Amniotic F.V:   Pocket => 2 cm             F. Tone:        Observed
 F. Movement:    Observed                   Score:          [DATE]
 F. Breathing:   Observed
OB History

 Gravidity:    1         Term:   0        Prem:   0        SAB:   0
 TOP:          0       Ectopic:  0        Living: 0
Gestational Age

 LMP:           33w 0d        Date:  05/13/19                 EDD:   02/17/20
 Best:          32w 4d     Det. By:  Early Ultrasound         EDD:   02/20/20
                                     (08/18/19)
Anatomy

 Cranium:               Previously seen        LVOT:                   Previously seen
 Cavum:                 Previously seen        Aortic Arch:            Previously seen
 Ventricles:            Appears normal         Ductal Arch:            Previously seen
 Choroid Plexus:        Previously seen        Diaphragm:              Appears normal
 Cerebellum:            Previously seen        Stomach:                Appears normal, left
                                                                       sided
 Posterior Fossa:       Previously seen        Abdomen:                Previously seen
 Nuchal Fold:           Not applicable (>20    Abdominal Wall:         Previously seen
                        wks GA)
 Face:                  Orbits and profile     Cord Vessels:           Previously seen
                        previously seen
 Lips:                  Previously seen        Kidneys:                Appear normal
 Palate:                Previously seen        Bladder:                Appears normal
 Thoracic:              Previously seen        Spine:                  Previously seen
 Heart:                 Appears normal         Upper Extremities:      Previously seen
                        (4CH, axis, and
                        situs)
 RVOT:                  Previously seen        Lower Extremities:      Previously seen

 Other:  Female gender previously seen. Nasal bone, VC, 3VV and 3VTV
         visualized previously.
Doppler - Fetal Vessels

 Umbilical Artery
  S/D     %tile      RI    %tile                             ADFV    RDFV
   2.5       41     0.6       48                                No      No

Cervix Uterus Adnexa

 Cervix
 Not visualized (advanced GA >24wks)

 Uterus
 No abnormality visualized.
 Right Ovary
 Within normal limits.

 Left Ovary
 Within normal limits.

 Cul De Sac
 No free fluid seen.

 Adnexa
 No abnormality visualized.
Impression

 Antenatal testing due to IUGR with an EFW 7th% with an AC
 < 4th%.
 Biophysical profile [DATE] with good fetal movement and
 amniotic fluid volume
 UA Dopplers are normal with no evidence of AEDF or REDF
Recommendations

 Growth, UA Dopplers and BPP scheduled in 1 week.

## 2021-05-10 IMAGING — US US MFM FETAL BPP W/O NON-STRESS
1 series · 12 of 28 positions shown · non-contrast
Comparison: none

[Series 1: us mfm fetal bpp w/o non-stress · 34 acquisitions, 12 frames shown]
[im 2/34]
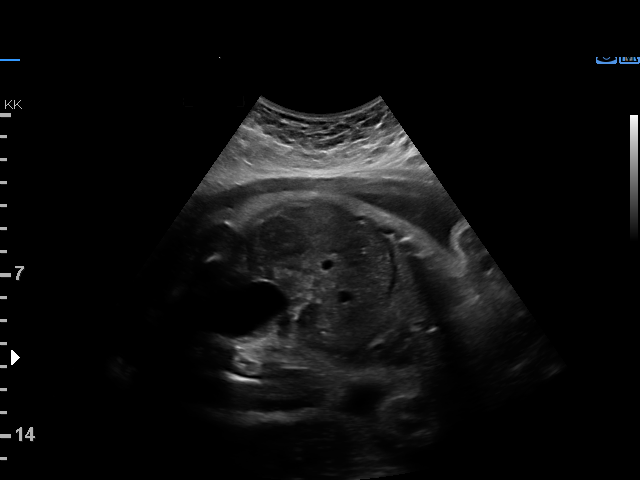
[im 4/34]
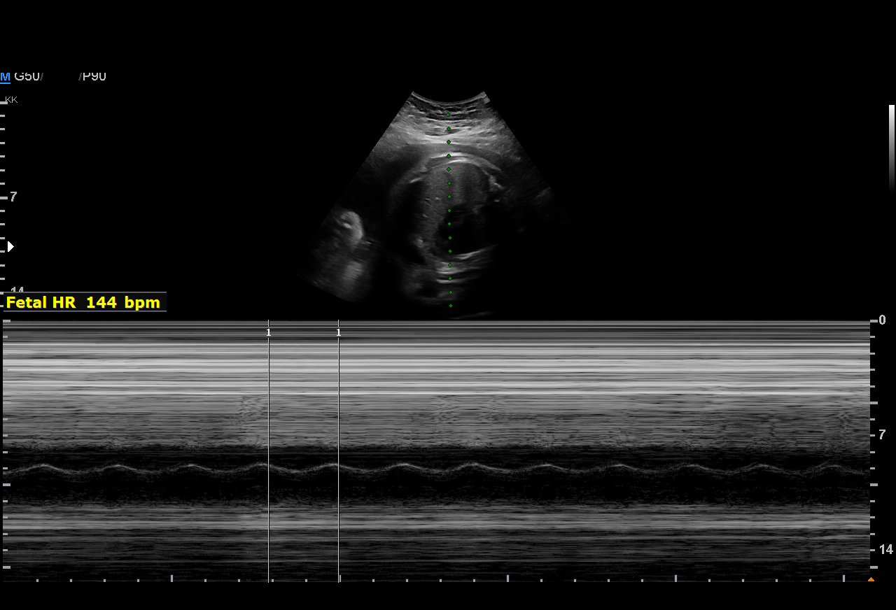
[im 7/34]
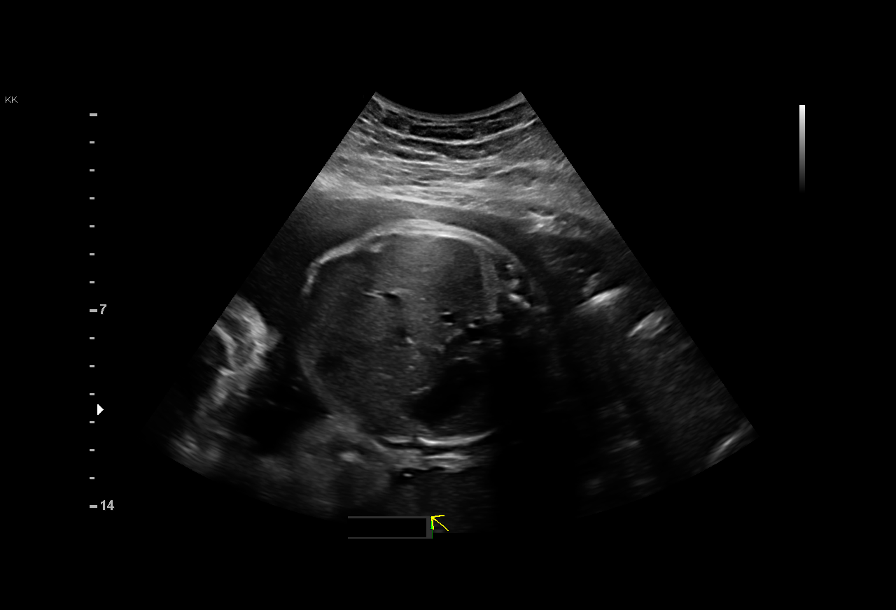
[im 10/34]
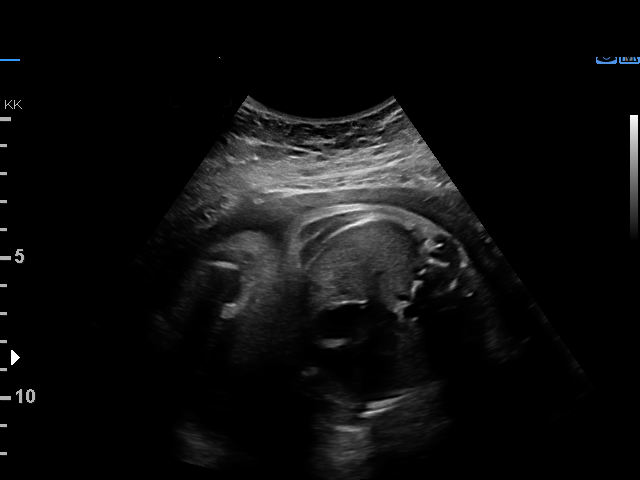
[im 13/34]
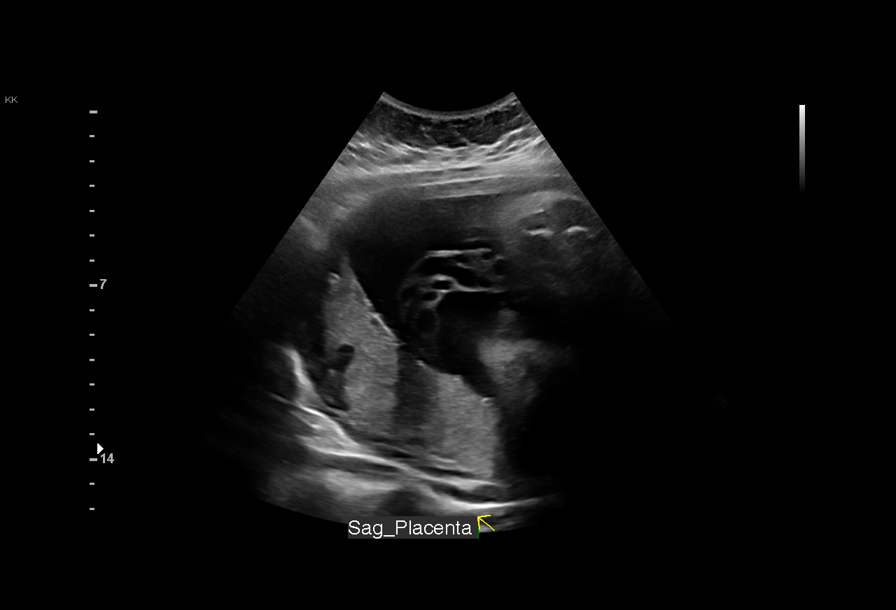
[im 15/34]
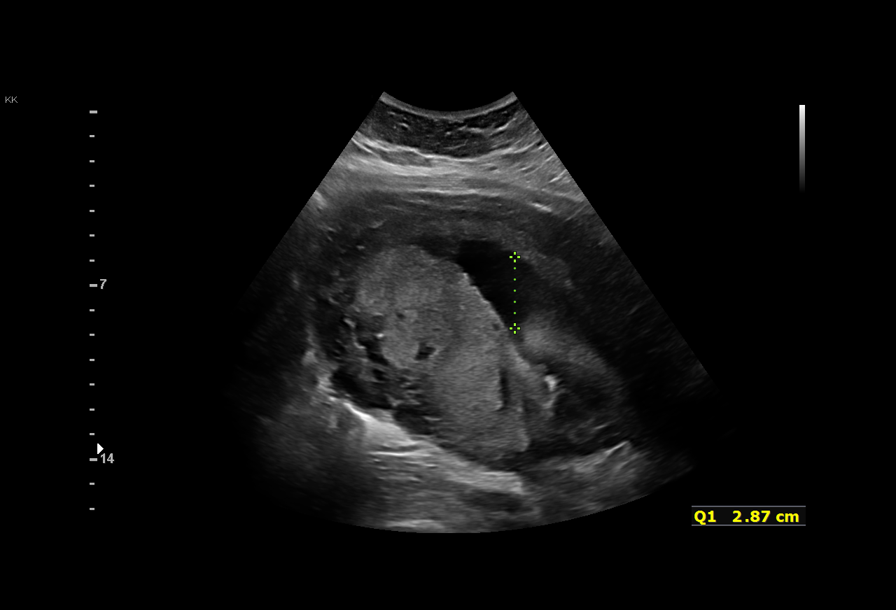
[im 19/34]
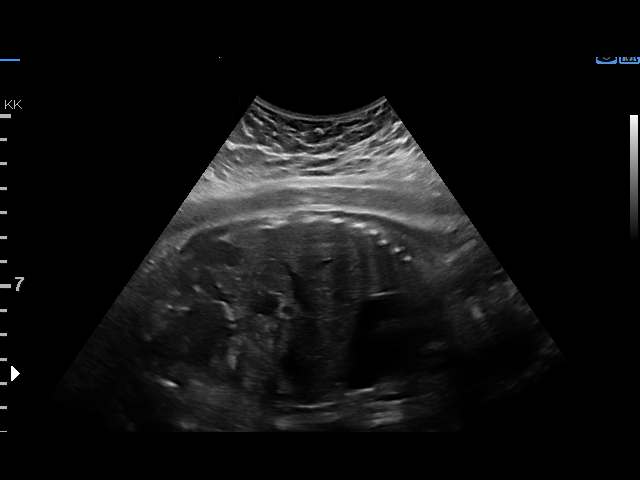
[im 21/34]
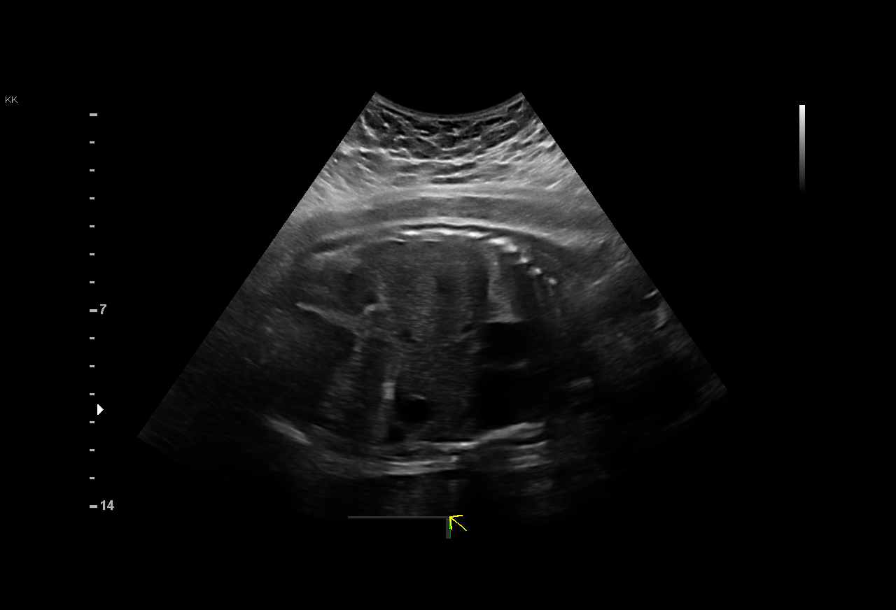
[im 24/34]
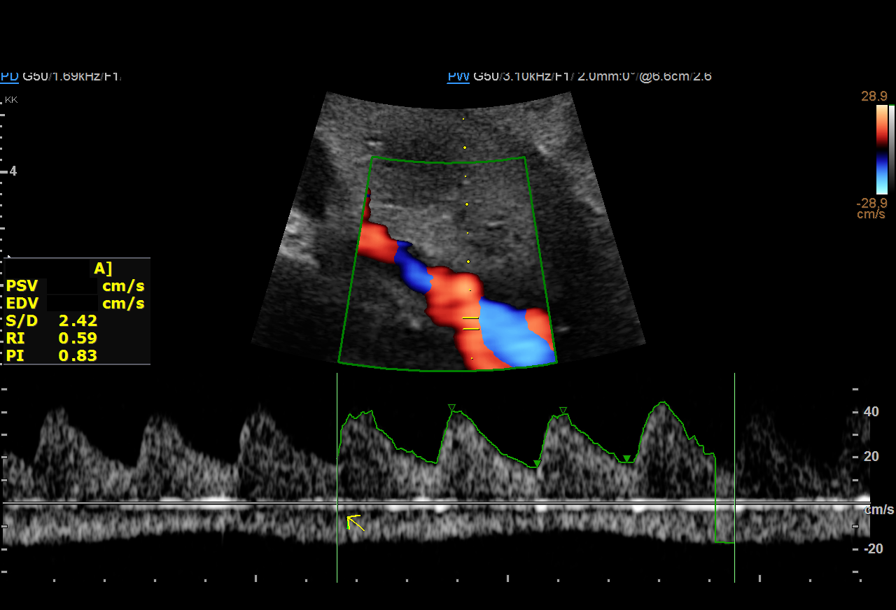
[im 27/34]
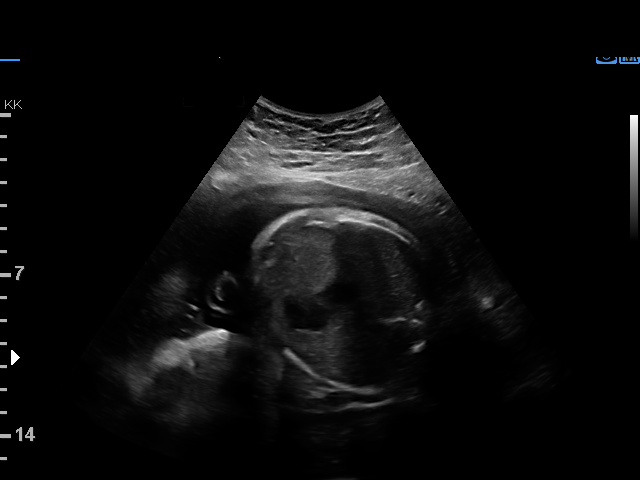
[im 30/34]
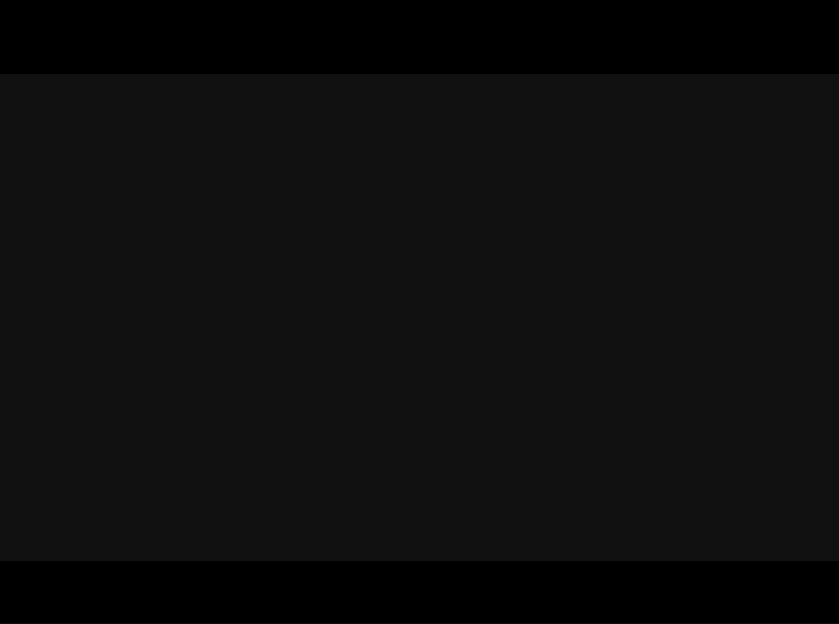
[im 32/34]
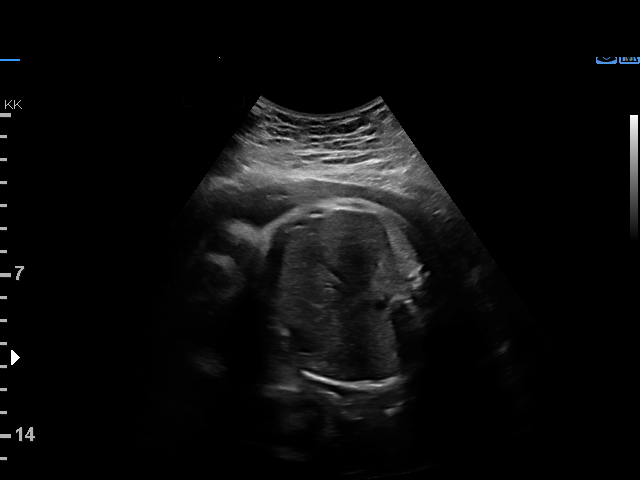

[12 of 28 positions shown; findings below may reference images not displayed]

MESTA NP

Indications

 Maternal care for known or suspected poor
 fetal growth, second trimester, not applicable
 or unspecified IUGR
 Low Risk NIPS
 35 weeks gestation of pregnancy
Fetal Evaluation

 Num Of Fetuses:         1
 Fetal Heart Rate(bpm):  144
 Cardiac Activity:       Observed
 Presentation:           Cephalic
 Placenta:               Posterior
 P. Cord Insertion:      Previously Visualized

 Amniotic Fluid
 AFI FV:      Within normal limits

 AFI Sum(cm)     %Tile       Largest Pocket(cm)
 15.2            55          6.

 RUQ(cm)       RLQ(cm)       LUQ(cm)        LLQ(cm)
 2.9           3.2           6
Biophysical Evaluation
 Amniotic F.V:   Pocket => 2 cm             F. Tone:        Observed
 F. Movement:    Observed                   Score:          [DATE]
 F. Breathing:   Observed
OB History

 Gravidity:    1         Term:   0        Prem:   0        SAB:   0
 TOP:          0       Ectopic:  0        Living: 0
Gestational Age

 LMP:           35w 6d        Date:  05/13/19                 EDD:   02/17/20
 Best:          35w 3d     Det. By:  Early Ultrasound         EDD:   02/20/20
                                     (08/18/19)
Anatomy

 Diaphragm:             Appears normal         Bladder:                Visualized
 Stomach:               Visualized
Doppler - Fetal Vessels

 Umbilical Artery
  S/D     %tile      RI    %tile                             ADFV    RDFV
  2.47       52    0.59       56                                No      No

Impression

 Antenatal testing due to IUGR with an EFW 2.2th% with an
 AC < 6th%.
 Biophysical profile [DATE] with good fetal movement and
 amniotic fluid volume
 UA Dopplers are normal with no evidence of AEDF or REDF
Recommendations

 Ms. Es is scheduled for BPP and BPP + growth in 2
 weeks.
